# Patient Record
Sex: Female | Born: 2000 | Race: White | Hispanic: No | Marital: Single | Smoking: Light tobacco smoker
Health system: Southern US, Community
[De-identification: ages and names within clinical notes are randomized; demographics above are authoritative.]

## PROBLEM LIST (undated history)

## (undated) DIAGNOSIS — F419 Anxiety disorder, unspecified: Secondary | ICD-10-CM

## (undated) DIAGNOSIS — I10 Essential (primary) hypertension: Secondary | ICD-10-CM

## (undated) DIAGNOSIS — L600 Ingrowing nail: Secondary | ICD-10-CM

## (undated) DIAGNOSIS — F32A Depression, unspecified: Secondary | ICD-10-CM

## (undated) DIAGNOSIS — F909 Attention-deficit hyperactivity disorder, unspecified type: Secondary | ICD-10-CM

## (undated) HISTORY — DX: Anxiety disorder, unspecified: F41.9

## (undated) HISTORY — DX: Depression, unspecified: F32.A

## (undated) HISTORY — DX: Essential (primary) hypertension: I10

## (undated) HISTORY — PX: WISDOM TOOTH EXTRACTION: SHX21

---
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
## 2019-01-30 ENCOUNTER — Other Ambulatory Visit: Payer: Self-pay | Admitting: Family

## 2019-01-30 MED ORDER — LISDEXAMFETAMINE DIMESYLATE 40 MG PO CAPS: 40.0000 mg | ORAL_CAPSULE | ORAL | 0 refills | Status: DC

## 2019-01-31 IMAGING — US US RENAL
1 series · 14 of 25 positions shown · non-contrast
Comparison: None.

CLINICAL DATA: Hypertension.

EXAM:
RENAL / URINARY TRACT ULTRASOUND COMPLETE

[Series 1: us renal · 0.23mm/px · 14 of 25 slices shown]
[im 1/25]
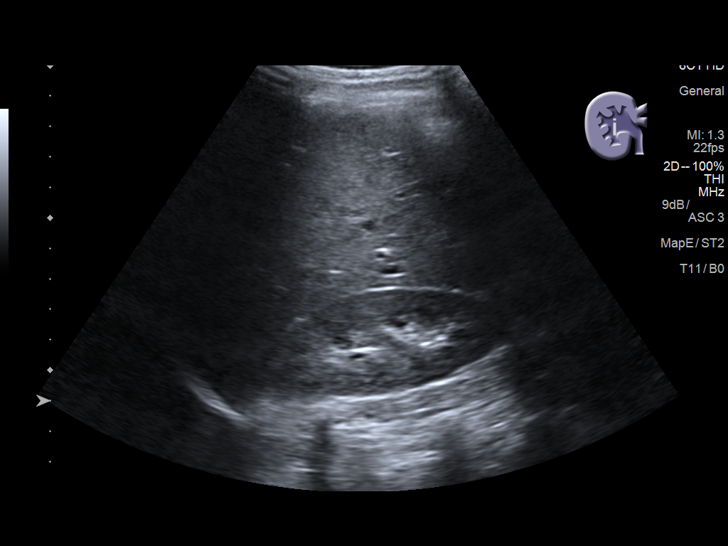
[im 3/25]
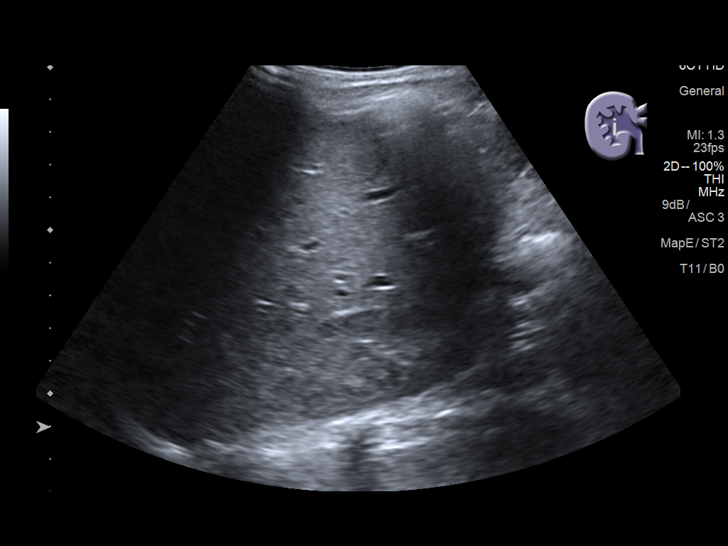
[im 5/25]
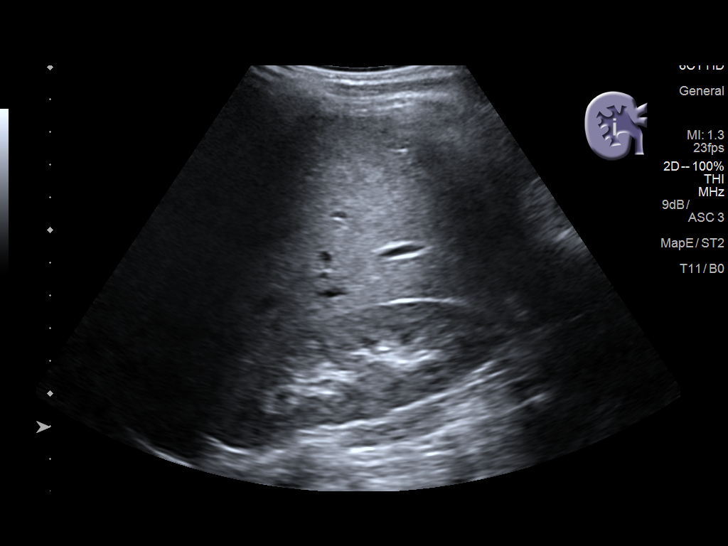
[im 7/25]
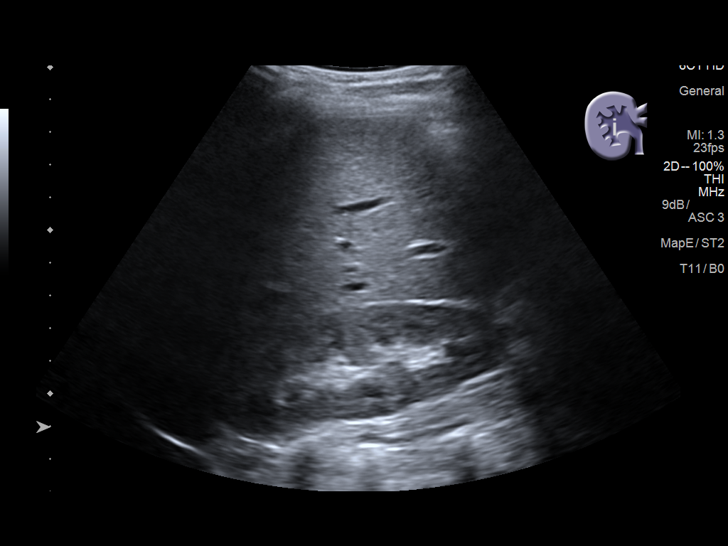
[im 9/25]
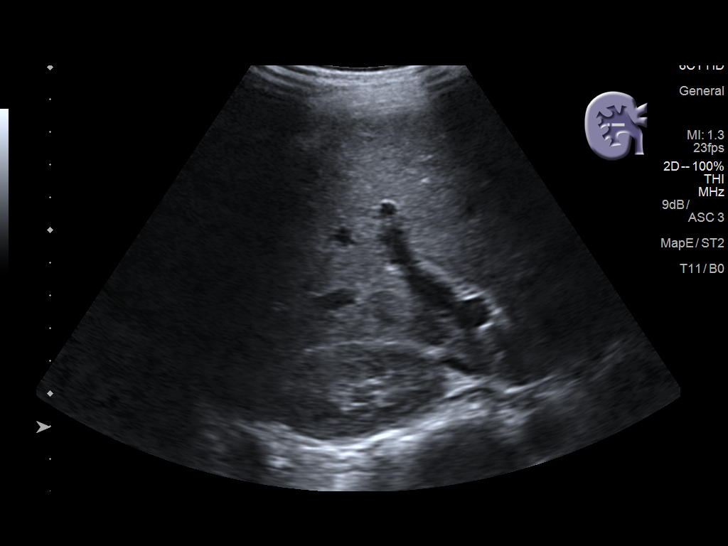
[im 10/25]
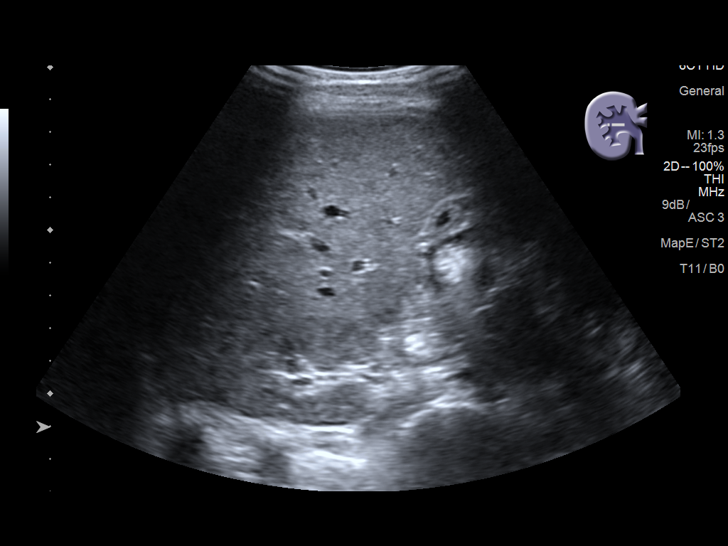
[im 12/25]
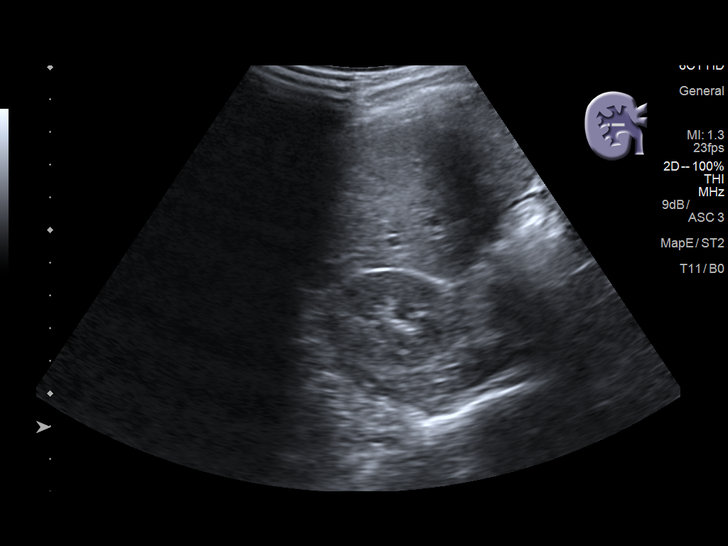
[im 14/25]
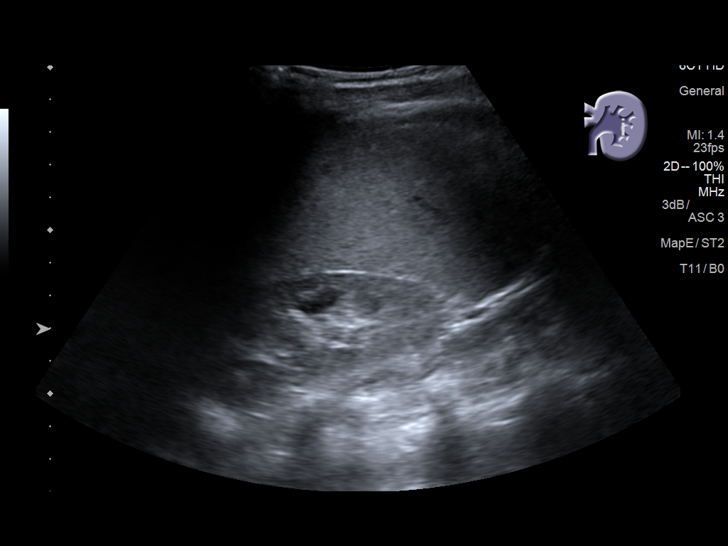
[im 16/25]
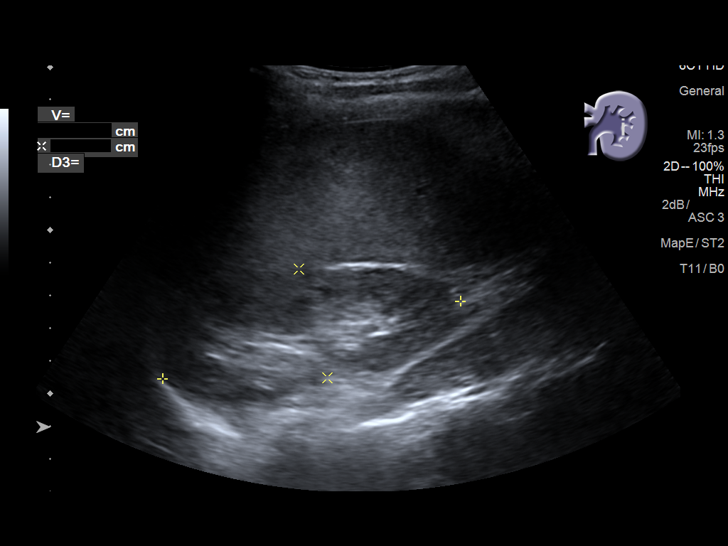
[im 17/25]
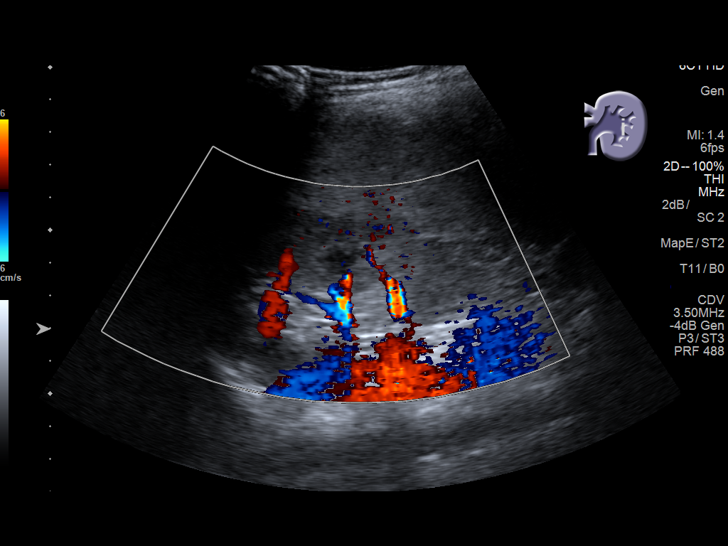
[im 19/25]
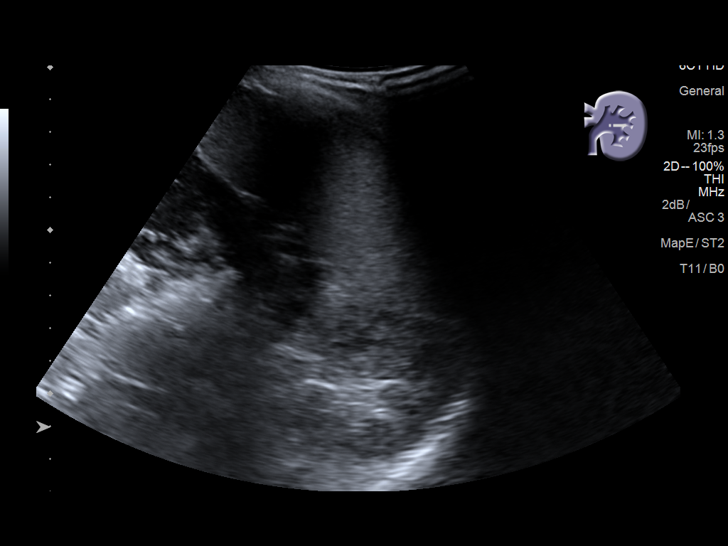
[im 21/25]
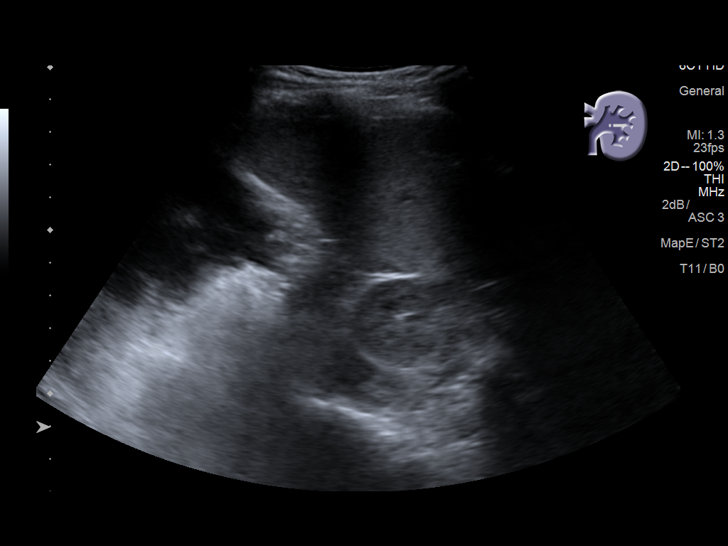
[im 23/25]
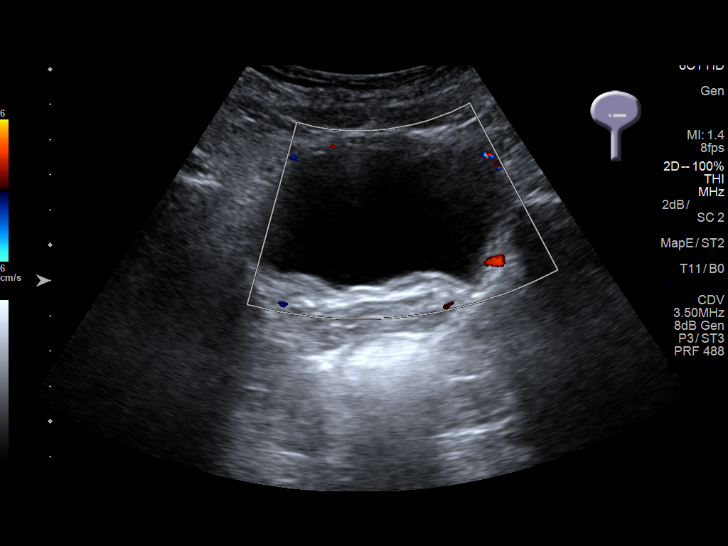
[im 25/25]
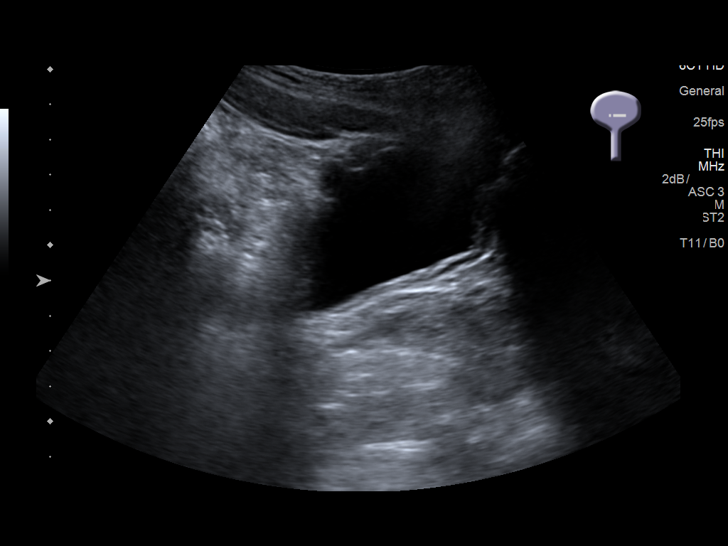

[14 of 25 positions shown; findings below may reference images not displayed]

FINDINGS: Right Kidney:

Renal measurements: 9.6 x 3.4 x 5.1 cm = volume: 85.9 mL .
Echogenicity within normal limits. No mass or hydronephrosis
visualized.

Left Kidney:

Renal measurements: 9.4 x 3.4 x 4.8 cm = volume: 81.6 mL.
Echogenicity within normal limits. No mass or hydronephrosis
visualized.

Bladder:

Appears normal for degree of bladder distention.
IMPRESSION: Normal study.  No cause for hypertension identified.

## 2019-02-12 MED ORDER — MINOCYCLINE HCL 100 MG PO CAPS: 100.0000 mg | ORAL_CAPSULE | Freq: Two times a day (BID) | ORAL | 3 refills | Status: DC

## 2019-02-18 MED ORDER — NORETHINDRONE ACET-ETHINYL EST 1.5-30 MG-MCG PO TABS: 1.0000 | ORAL_TABLET | Freq: Every day | ORAL | 3 refills | Status: DC

## 2019-03-17 ENCOUNTER — Other Ambulatory Visit: Payer: Self-pay | Admitting: Family

## 2019-03-17 MED ORDER — LISDEXAMFETAMINE DIMESYLATE 40 MG PO CAPS: 40.0000 mg | ORAL_CAPSULE | ORAL | 0 refills | Status: DC

## 2019-05-01 ENCOUNTER — Other Ambulatory Visit: Payer: Self-pay | Admitting: Family

## 2019-05-01 MED ORDER — LISDEXAMFETAMINE DIMESYLATE 40 MG PO CAPS: 40.0000 mg | ORAL_CAPSULE | ORAL | 0 refills | Status: DC

## 2019-06-08 ENCOUNTER — Other Ambulatory Visit: Payer: Self-pay | Admitting: Pediatrics

## 2019-06-08 MED ORDER — NORETHINDRONE ACET-ETHINYL EST 1.5-30 MG-MCG PO TABS: 1.0000 | ORAL_TABLET | Freq: Every day | ORAL | 3 refills | Status: DC

## 2019-06-15 ENCOUNTER — Other Ambulatory Visit: Payer: Self-pay | Admitting: Pediatrics

## 2019-07-29 ENCOUNTER — Ambulatory Visit (INDEPENDENT_AMBULATORY_CARE_PROVIDER_SITE_OTHER): Payer: Medicaid Other | Admitting: Family

## 2019-07-29 ENCOUNTER — Encounter: Payer: Self-pay | Admitting: Family

## 2019-07-29 DIAGNOSIS — F4323 Adjustment disorder with mixed anxiety and depressed mood: Secondary | ICD-10-CM

## 2019-07-29 DIAGNOSIS — L700 Acne vulgaris: Secondary | ICD-10-CM | POA: Diagnosis not present

## 2019-07-29 DIAGNOSIS — F902 Attention-deficit hyperactivity disorder, combined type: Secondary | ICD-10-CM

## 2019-07-30 ENCOUNTER — Encounter: Payer: Self-pay | Admitting: Family

## 2019-07-30 NOTE — Progress Notes (Signed)
THIS RECORD MAY CONTAIN CONFIDENTIAL INFORMATION THAT SHOULD NOT BE RELEASED WITHOUT REVIEW OF THE SERVICE PROVIDER.  Virtual Follow-Up Visit via Video Note  I connected with Gail Reed  on 07/29/19 at 10:30 AM EST by a video enabled telemedicine application and verified that I am speaking with the correct person using two identifiers.    This patient visit was completed through the use of an audio/video or telephone encounter in the setting of the State of Emergency due to the COVID-19 Pandemic.  I discussed that the purpose of this telehealth visit is to provide medical care while limiting exposure to the novel coronavirus.       I discussed the limitations of evaluation and management by telemedicine and the availability of in person appointments.    The patient expressed understanding and agreed to proceed.   The patient was physically located at home in [place] or a state in which I am permitted to provide care. The patient and/or parent/guardian understood that s/he may incur co-pays and cost sharing, and agreed to the telemedicine visit. The visit was reasonable and appropriate under the circumstances given the patient's presentation at the time.   The patient and/or parent/guardian has been advised of the potential risks and limitations of this mode of treatment (including, but not limited to, the absence of in-person examination) and has agreed to be treated using telemedicine. The patient's/patient's family's questions regarding telemedicine have been answered.    As this visit was completed in an ambulatory virtual setting, the patient and/or parent/guardian has also been advised to contact their provider's office for worsening conditions, and seek emergency medical treatment and/or call 911 if the patient deems either necessary.    Gail Reed is a 18 y.o. female referred by Marcelina Morel, MD here today for follow-up of ADHD, adjustment disorder with mixed  anxiety and depressed mood.    History was provided by the patient.  PCP Confirmed?  yes  My Chart Activated?   yes   Chief Complaint: ADHD, acne   History of Present Illness:  -going well, needs refill for Vyvanse 40 mg; of note, she was cleared by Cardiology in January 2020 after sinus tachycardia with abnormal EKG, right atrial enlargement.  -today she has no conerning symptoms, specifically no chest pain or dyspnea.  Denies palpitations, denies HA.  -no concerns with focus -appetite is described as normal with no concerns  -sleep is adequate -no si/hi, taking sertraline 50 mg with benefit.  -needs acne meds  nexplanon with Junel for BTB as needed -no concerns for STI screening  Review of Systems  Constitutional: Negative for fever, malaise/fatigue and weight loss.  HENT: Negative for sore throat.   Eyes: Negative for blurred vision and photophobia.  Respiratory: Negative for cough and shortness of breath.   Cardiovascular: Negative for chest pain and palpitations.  Gastrointestinal: Negative for abdominal pain and heartburn.  Genitourinary: Negative for dysuria and frequency.  Musculoskeletal: Negative for myalgias.  Neurological: Negative for dizziness.  Psychiatric/Behavioral: The patient is not nervous/anxious.      Allergies  Allergen Reactions  . Amoxicillin Rash    AS A CHILD   Outpatient Medications Prior to Visit  Medication Sig Dispense Refill  . clindamycin-benzoyl peroxide (BENZACLIN) gel 1 (ONE) APPLICATION TO AFFECTED AREA NIGHTLY  6  . lisdexamfetamine (VYVANSE) 40 MG capsule Take 1 capsule (40 mg total) by mouth every morning. 30 capsule 0  . minocycline (MINOCIN) 100 MG capsule TAKE 1 CAPSULE BY MOUTH TWICE  A DAY 60 capsule 3  . Norethindrone Acetate-Ethinyl Estradiol (JUNEL 1.5/30) 1.5-30 MG-MCG tablet Take 1 tablet by mouth daily. 1 Package 3  . sertraline (ZOLOFT) 50 MG tablet Take 1 tablet (50 mg total) by mouth daily. 90 tablet 0   No  facility-administered medications prior to visit.      Patient Active Problem List   Diagnosis Date Noted  . Sinus tachycardia 09/25/2018  . Abnormal EKG 09/25/2018  . Elevated BP without diagnosis of hypertension 04/28/2018  . 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 Nexplanon in place 11/18/2017  . Attention deficit hyperactivity disorder (ADHD), 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  . Adjustment disorder 10/22/2017    Past Medical History:  Reviewed and updated?  yes Past Medical History:  Diagnosis Date  . ADHD (attention deficit hyperactivity disorder)   . Ingrown left big toenail 01/2015    Family History: Reviewed and updated? yes Family History  Problem Relation Age of Onset  . Bipolar disorder Brother   . Eating disorder Maternal Aunt   . Hypertension Mother   . Hypertension Father   . Hyperlipidemia Father   . Hyperlipidemia Paternal Aunt   . Hyperlipidemia Maternal Grandmother    The following portions of the patient's history were reviewed and updated as appropriate: allergies, current medications, past family history, past medical history, past social history, past surgical history and problem list.  Visual Observations/Objective:   General Appearance: Well nourished well developed, in no apparent distress.  Eyes: conjunctiva no swelling or erythema ENT/Mouth: No hoarseness, No cough for duration of visit.  Neck: Supple  Respiratory: Respiratory effort normal, normal rate, no retractions or distress.   Cardio: Appears well-perfused, noncyanotic Musculoskeletal: no obvious deformity Skin: visible skin without rashes, ecchymosis, erythema Neuro: Awake and oriented X 3,  Psych:  normal affect, Insight and Judgment appropriate.    Assessment/Plan: 1. Attention deficit hyperactivity disorder (ADHD), combined type -stable on current dose Vvyanse 40 mg without adverse effects  -return precautions given   2. Adjustment disorder with mixed anxiety and depressed mood -doing  well with sertraline 50 mg  -no indication for change in therapy  3. Acne vulgaris -Rx refilled - benzaclin   I discussed the assessment and treatment plan with the patient and/or parent/guardian.  They were provided an opportunity to ask questions and all were answered.  They agreed with the plan and demonstrated an understanding of the instructions. They were advised to call back or seek an in-person evaluation in the emergency room if the symptoms worsen or if the condition fails to improve as anticipated.   Follow-up:  3 month follow-up or sooner as needed  Medical decision-making:   I spent 15 minutes on this telehealth visit inclusive of face-to-face video and care coordination time I was located remote in [place] during this encounter.   Georges Mouse, NP    CC: [name], MD, [name], MD

## 2019-07-31 ENCOUNTER — Encounter: Payer: Self-pay | Admitting: Family

## 2019-08-03 ENCOUNTER — Other Ambulatory Visit: Payer: Self-pay | Admitting: Family

## 2019-08-03 DIAGNOSIS — F4323 Adjustment disorder with mixed anxiety and depressed mood: Secondary | ICD-10-CM

## 2019-08-03 MED ORDER — LISDEXAMFETAMINE DIMESYLATE 40 MG PO CAPS: 40.0000 mg | ORAL_CAPSULE | ORAL | 0 refills | Status: DC

## 2019-08-03 MED ORDER — SERTRALINE HCL 50 MG PO TABS: 50.0000 mg | ORAL_TABLET | Freq: Every day | ORAL | 0 refills | Status: DC

## 2019-08-03 MED ORDER — CLINDAMYCIN PHOS-BENZOYL PEROX 1-5 % EX GEL: CUTANEOUS | 6 refills | Status: DC

## 2019-08-05 ENCOUNTER — Encounter: Payer: Self-pay | Admitting: Family

## 2019-08-06 ENCOUNTER — Encounter: Payer: Self-pay | Admitting: Family

## 2019-08-11 ENCOUNTER — Other Ambulatory Visit: Payer: Self-pay

## 2019-08-11 DIAGNOSIS — Z20822 Contact with and (suspected) exposure to covid-19: Secondary | ICD-10-CM

## 2019-08-13 LAB — NOVEL CORONAVIRUS, NAA: SARS-CoV-2, NAA: NOT DETECTED

## 2019-10-09 ENCOUNTER — Other Ambulatory Visit: Payer: Self-pay | Admitting: Family

## 2019-10-09 ENCOUNTER — Encounter: Payer: Self-pay | Admitting: Family

## 2019-10-09 MED ORDER — LISDEXAMFETAMINE DIMESYLATE 40 MG PO CAPS: 40.0000 mg | ORAL_CAPSULE | ORAL | 0 refills | Status: DC

## 2019-10-28 ENCOUNTER — Telehealth (INDEPENDENT_AMBULATORY_CARE_PROVIDER_SITE_OTHER): Payer: Medicaid Other | Admitting: Family

## 2019-10-28 DIAGNOSIS — F4323 Adjustment disorder with mixed anxiety and depressed mood: Secondary | ICD-10-CM

## 2019-10-28 DIAGNOSIS — L709 Acne, unspecified: Secondary | ICD-10-CM | POA: Insufficient documentation

## 2019-10-28 DIAGNOSIS — F902 Attention-deficit hyperactivity disorder, combined type: Secondary | ICD-10-CM | POA: Diagnosis not present

## 2019-10-28 DIAGNOSIS — R630 Anorexia: Secondary | ICD-10-CM

## 2019-10-28 DIAGNOSIS — Z72820 Sleep deprivation: Secondary | ICD-10-CM | POA: Diagnosis not present

## 2019-10-28 NOTE — Progress Notes (Signed)
This note is not being shared with the patient for the following reason: To respect privacy (The patient or proxy has requested that the information not be shared).  THIS RECORD MAY CONTAIN CONFIDENTIAL INFORMATION THAT SHOULD NOT BE RELEASED WITHOUT REVIEW OF THE SERVICE PROVIDER.  Virtual Follow-Up Visit via Video Note  I connected with Gail Reed 's patient  on 10/28/19 at  4:00 PM EST by a video enabled telemedicine application and verified that I am speaking with the correct person using two identifiers.   Patient/parent location: dorm room   I discussed the limitations of evaluation and management by telemedicine and the availability of in person appointments.  I discussed that the purpose of this telehealth visit is to provide medical care while limiting exposure to the novel coronavirus.  The patient expressed understanding and agreed to proceed.   Gail Reed is a 19 y.o. female referred by Marcelina Morel, MD here today for follow-up of ADHD.  Previsit planning completed:  yes   History was provided by the patient  Plan from Last Visit:   anxiety / depressed mood - continue sertraline 35m ADHD - continue vyvanse 410macne - benzaclin, minocycline  Nexplanon, Junel (BTB prn)  Cardiology - Normal ECHO. Repeat ECG normal. Documentation seems to suggest reducing Vyvanse dose? Unclear.  Chief Complaint: Poor concentrattion  History of Present Illness:   [employer] sophomore, studying dance. 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 Concerned poor concentration is preventing her from doing better.  ADHD - Continues to take daily Vyvanse. Says dose unchanged from prior visit. Having trouble concentrating on HW.  Last semester concentration was good; since that time, had breakup with boyfriend that she used to study with. Now no longer has study group. Works better when others around because they keep her on track. Studies in dorm room  -- has not tried going to [employer]: has missed a few assignments; did not forget assignment, "not sure why I didn't turn them in." - Appetite: poor for ~1 month. Eating one meal per day + snack. Not drinking much water. Unsure why decreased appetite. Appetite typically much better when not taking vyvanse, though off vyvanse over winter break and appetite did not return. Can tell she has lost weight; does not have a scale but pants are fitting looser. Has been successful in eating 3x per day last two days. Sometimes forgets to eat or doesn't have time to. Often doesn't go to cafeteria because it "takes a long time to decide on outfit" since she typically wears a leotard for dancing. - Sleep: Going to sleep at 2am because up with friends in her dorm. Sleeps 2am-7am. No roommate. No trouble staying asleep.   Anxiety / depressed mood - sertraline 5039m Mood has been "great." Denies feeling sad, down, depressed. No anhedonia. - Denies SI  Refills needed: none   ROS:  negative  Allergies  Allergen Reactions  . Amoxicillin Rash    AS A CHILD   Outpatient Medications Prior to Visit  Medication Sig Dispense Refill  . clindamycin-benzoyl peroxide (BENZACLIN) gel 1 (ONE) APPLICATION TO AFFECTED AREA NIGHTLY 25 g 6  . lisdexamfetamine (VYVANSE) 40 MG capsule Take 1 capsule (40 mg total) by mouth every morning. 30 capsule 0  . minocycline (MINOCIN) 100 MG capsule TAKE 1 CAPSULE BY MOUTH TWICE A DAY 60 capsule 3  . Norethindrone Acetate-Ethinyl Estradiol (JUNEL 1.5/30) 1.5-30 MG-MCG tablet Take 1 tablet by mouth daily.  1 Package 3  . sertraline (ZOLOFT) 50 MG tablet Take 1 tablet (50 mg total) by mouth daily. 90 tablet 0   No facility-administered medications prior to visit.     Patient Active Problem List   Diagnosis Date Noted  . Sinus tachycardia 09/25/2018  . Abnormal EKG 09/25/2018  . Elevated BP without diagnosis of hypertension 04/28/2018  . 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  Nexplanon in place 11/18/2017  . Attention deficit hyperactivity disorder (ADHD), 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  . Adjustment disorder 10/22/2017    Visual Observations/Objective:  General Appearance: Well nourished well developed, in no apparent distress.  Eyes: conjunctiva no swelling or erythema ENT/Mouth: No hoarseness, No cough for duration of visit.  Neck: Supple  Respiratory: Respiratory effort normal, normal rate, no retractions or distress.   Cardio: Appears well-perfused, noncyanotic Musculoskeletal: no obvious deformity Skin: visible skin without rashes, ecchymosis, erythema Neuro: Awake and oriented X 3,  Psych:  normal affect, Insight and Judgment appropriate.    Assessment/Plan: 1. Attention deficit hyperactivity disorder (ADHD), combined type - Continue vyvanse  - avoid increasing dose given prior cardiac SEs - Study in [employer] with study group, make study calendar so not procrastinating on assignments - Improved sleep to improve concentration  2. Sleep deficient See above - For next week, force self to get into bed no later than 1am (recommended earlier)  3. Acne, unspecified acne type Continue meds as above  4. Adjustment disorder with mixed anxiety and depressed mood Continue sertraline. Doing well.  5. Decreased appetite See above - Keep food in dorm room (discussed bagels etc) for easy breakfasts - Wardrobe / appearance currently barrier to going to cafeteria. Decide on wardrobe night before, wear those clothes all day except when dancing. - Scheduled time for meals in cafeteria    I discussed the assessment and treatment plan with the patient and/or parent/guardian.  They were provided an opportunity to ask questions and all were answered.  They agreed with the plan and demonstrated an understanding of the instructions. They were advised to call back or seek an in-person evaluation in the emergency room if the symptoms worsen or if the condition fails to  improve as anticipated.   Follow-up:   1-2 weeks in person for weight, vitals check  Medical decision-making:   I spent 40 minutes on this telehealth visit inclusive of face-to-face video and care coordination time I was located at [facility] during this encounter.   Harlon Ditty, MD    CC: Marcelina Morel, MD, Marcelina Morel, MD

## 2019-10-29 ENCOUNTER — Encounter: Payer: Self-pay | Admitting: Family

## 2019-10-29 NOTE — Progress Notes (Signed)
Supervising Provider Co-Signature  I reviewed with the resident the medical history and the resident's findings.  I discussed with the resident the patient's diagnosis and concur with the treatment plan as documented in the resident's note.  Artyom Stencel M Nyree Yonker, NP  

## 2019-11-11 ENCOUNTER — Telehealth: Payer: Self-pay | Admitting: Pediatrics

## 2019-11-11 NOTE — Telephone Encounter (Signed)
Sent patient a MyChart message

## 2019-11-11 NOTE — Telephone Encounter (Signed)
Called to [facility] for upcoming vis

## 2019-11-11 NOTE — Telephone Encounter (Signed)
Called to [facility] patient for upcoming appointment and she explained that she was unable to do in person at the time due to having class. She would like to know if this appointment could be Virtual and if she can get a call back to reschedule. Phone number listed is correct.

## 2019-11-12 ENCOUNTER — Other Ambulatory Visit: Payer: Self-pay | Admitting: Pediatrics

## 2019-11-12 ENCOUNTER — Encounter: Payer: Self-pay | Admitting: Family

## 2019-11-12 ENCOUNTER — Ambulatory Visit: Payer: Medicaid Other | Admitting: Family

## 2019-11-12 MED ORDER — LISDEXAMFETAMINE DIMESYLATE 40 MG PO CAPS: 40.0000 mg | ORAL_CAPSULE | ORAL | 0 refills | Status: DC

## 2019-11-12 MED ORDER — NORETHINDRONE ACET-ETHINYL EST 1.5-30 MG-MCG PO TABS: 1.0000 | ORAL_TABLET | Freq: Every day | ORAL | 3 refills | Status: DC

## 2019-11-12 MED ORDER — CETIRIZINE HCL 10 MG PO TABS: ORAL_TABLET | ORAL | 2 refills | Status: DC

## 2019-12-09 ENCOUNTER — Ambulatory Visit: Payer: Medicaid Other | Attending: Internal Medicine

## 2019-12-09 DIAGNOSIS — Z20822 Contact with and (suspected) exposure to covid-19: Secondary | ICD-10-CM

## 2019-12-10 LAB — NOVEL CORONAVIRUS, NAA: SARS-CoV-2, NAA: NOT DETECTED

## 2019-12-10 LAB — SARS-COV-2, NAA 2 DAY TAT

## 2019-12-22 ENCOUNTER — Other Ambulatory Visit: Payer: Self-pay

## 2019-12-23 MED ORDER — LISDEXAMFETAMINE DIMESYLATE 40 MG PO CAPS: 40.0000 mg | ORAL_CAPSULE | ORAL | 0 refills | Status: DC

## 2019-12-29 ENCOUNTER — Other Ambulatory Visit: Payer: Self-pay | Admitting: Family

## 2020-01-23 ENCOUNTER — Encounter: Payer: Self-pay | Admitting: Family

## 2020-01-30 ENCOUNTER — Encounter: Payer: Self-pay | Admitting: Family

## 2020-02-05 ENCOUNTER — Other Ambulatory Visit: Payer: Self-pay

## 2020-02-05 ENCOUNTER — Other Ambulatory Visit: Payer: Self-pay | Admitting: Family

## 2020-02-05 ENCOUNTER — Encounter (HOSPITAL_COMMUNITY): Payer: Self-pay

## 2020-02-05 ENCOUNTER — Ambulatory Visit (HOSPITAL_COMMUNITY)
Admission: EM | Admit: 2020-02-05 | Discharge: 2020-02-05 | Disposition: A | Discharge status: Home / Self Care | Payer: Medicaid Other | Attending: Physician Assistant | Admitting: Physician Assistant

## 2020-02-05 DIAGNOSIS — L509 Urticaria, unspecified: Secondary | ICD-10-CM | POA: Diagnosis not present

## 2020-02-05 DIAGNOSIS — F4323 Adjustment disorder with mixed anxiety and depressed mood: Secondary | ICD-10-CM

## 2020-02-05 MED ORDER — CETIRIZINE HCL 10 MG PO TABS: 10.0000 mg | ORAL_TABLET | Freq: Every day | ORAL | 0 refills | Status: DC

## 2020-02-05 MED ORDER — PREDNISONE 10 MG PO TABS: 40.0000 mg | ORAL_TABLET | Freq: Every day | ORAL | 0 refills | Status: AC

## 2020-02-05 NOTE — ED Provider Notes (Signed)
[facility]    CSN: [id number] Arrival date & time: 02/05/20  1734      History   Chief Complaint Chief Complaint  Patient presents with  . Rash    HPI Gail Reed is a 19 y.o. female.   Patient reports for evaluation of rash.  She reports she has had hives like rash since early May.  She reports it is itchy.  She reports it got worse after she stopped taking Zyrtec.  She reports first very long time she is gone itchy if she did not take Zyrtec every day.  She is unclear of what she is allergic to.  She denies the rash is painful.  No shortness of breath.  No coughing or wheezing.  She denies any new lotions, soaps or exposures.  She has no idea what she can be allergic to.  She is adamant this has been an ongoing issue.  She had MyChart conversation with what appeared to be a previous primary care provider in mid May discussing same.     Past Medical History:  Diagnosis Date  . ADHD (attention deficit hyperactivity disorder)   . Ingrown left big toenail 01/2015    Patient Active Problem List   Diagnosis Date Noted  . Sleep deficient 10/28/2019  . Acne 10/28/2019  . Decreased appetite 10/28/2019  . Sinus tachycardia 09/25/2018  . Abnormal EKG 09/25/2018  . Elevated BP without diagnosis of hypertension 04/28/2018  . 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 Nexplanon in place 11/18/2017  . Attention deficit hyperactivity disorder (ADHD), 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  . Adjustment disorder 10/22/2017    Past Surgical History:  Procedure Laterality Date  . LABIOPLASTY N/A 03/21/2018   Procedure: LABIAPLASTY;  Surgeon: Glenna Fellows, MD;  Location: [facility];  Service: Plastics;  Laterality: N/A;  . STRABISMUS SURGERY Bilateral 04/16/2003  . WISDOM TOOTH EXTRACTION      OB History   No obstetric history on file.      Home Medications    Prior to Admission medications   Medication Sig Start Date End Date Taking? Authorizing Provider    cetirizine (ZYRTEC ALLERGY) 10 MG tablet Take 1 tablet (10 mg total) by mouth daily. 02/05/20   Zaylei Mullane, Veryl Speak, PA-C  clindamycin-benzoyl peroxide (BENZACLIN) gel 1 (ONE) APPLICATION TO AFFECTED AREA NIGHTLY 08/03/19   Georges Mouse, NP  lisdexamfetamine (VYVANSE) 40 MG capsule Take 1 capsule (40 mg total) by mouth every morning. 12/23/19   Georges Mouse, NP  minocycline (MINOCIN) 100 MG capsule TAKE 1 CAPSULE BY MOUTH TWICE A DAY 06/15/19   Alfonso Ramus T, FNP  Norethindrone Acetate-Ethinyl Estradiol (JUNEL 1.5/30) 1.5-30 MG-MCG tablet Take 1 tablet by mouth daily. 11/12/19   Verneda Skill, FNP  predniSONE (DELTASONE) 10 MG tablet Take 4 tablets (40 mg total) by mouth daily for 5 days. 02/05/20 02/10/20  Joia Doyle, Veryl Speak, PA-C  sertraline (ZOLOFT) 50 MG tablet Take 1 tablet (50 mg total) by mouth daily. 08/03/19   Georges Mouse, NP    Family History Family History  Problem Relation Age of Onset  . Bipolar disorder Brother   . Eating disorder Maternal Aunt   . Hypertension Mother   . Hypertension Father   . Hyperlipidemia Father   . Hyperlipidemia Paternal Aunt   . Hyperlipidemia Maternal Grandmother     Social History Social History   Tobacco Use  . Smoking status: Never Smoker  . Smokeless tobacco: Never Used  Substance Use Topics  .  Alcohol use: No  . Drug use: Yes    Types: Marijuana     Allergies   Amoxicillin   Review of Systems Review of Systems   Physical Exam Triage Vital Signs ED Triage Vitals  Enc Vitals Group     BP 02/05/20 1834 (!) 148/84     Pulse Rate 02/05/20 1834 96     Resp 02/05/20 1834 14     Temp 02/05/20 1834 98.3 F (36.8 C)     Temp Source 02/05/20 1834 Oral     SpO2 02/05/20 1834 100 %     Weight --      Height --      Head Circumference --      Peak Flow --      Pain Score 02/05/20 1835 8     Pain Loc --      Pain Edu? --      Excl. in [place]? --    No data found.  Updated Vital Signs BP (!) 148/84 (BP Location: Left  Arm)   Pulse 96   Temp 98.3 F (36.8 C) (Oral)   Resp 14   SpO2 100%   Visual Acuity Right Eye Distance:   Left Eye Distance:   Bilateral Distance:    Right Eye Near:   Left Eye Near:    Bilateral Near:     Physical Exam Vitals and nursing note reviewed.  Constitutional:      General: She is not in acute distress.    Appearance: She is well-developed.  HENT:     Head: Normocephalic and atraumatic.  Eyes:     Conjunctiva/sclera: Conjunctivae normal.  Cardiovascular:     Rate and Rhythm: Normal rate and regular rhythm.     Heart sounds: No murmur.  Pulmonary:     Effort: Pulmonary effort is normal. No respiratory distress.     Breath sounds: Normal breath sounds.  Abdominal:     Palpations: Abdomen is soft.     Tenderness: There is no abdominal tenderness.  Musculoskeletal:     Cervical back: Neck supple.  Skin:    General: Skin is warm and dry.     Comments: Blanching erythematous slightly raised rash with dermatographia bilateral thighs.  There are some punctate openings from where she has scratched.  There are a few similar rashes on the left upper extremity.  No pustules or vesicles  Neurological:     Mental Status: She is alert.      UC Treatments / Results  Labs (all labs ordered are listed, but only abnormal results are displayed) Labs Reviewed - No data to display  EKG   Radiology No results found.  Procedures Procedures (including critical care time)  Medications Ordered in UC Medications - No data to display  Initial Impression / Assessment and Plan / UC Course  I have reviewed the triage vital signs and the nursing notes.  Pertinent labs & imaging results that were available during my care of the patient were reviewed by me and considered in my medical decision making (see chart for details).     #Urticaria Patient is a 19 year old with acute on chronic urticarial rash.  Unclear trigger.  Will place on prednisone, recommend twice daily  Zyrtec until rash is under control and then once daily.  Instructed to reestablish with primary care for follow-up and allergy testing.  Return precautions were discussed.  Patient verbalized understanding. Final Clinical Impressions(s) / UC Diagnoses   Final diagnoses:  Hives  Discharge Instructions     Take the prednisone in the morning for the next 5 days Take Zyrtec twice a day for the next 5 days and then resume once a day  Schedule with primary care at [facility] as previously discussed by your nurse practitioner.  If acutely worsening prior to follow-up with primary care please return.    ED Prescriptions    Medication Sig Dispense Auth. Provider   predniSONE (DELTASONE) 10 MG tablet Take 4 tablets (40 mg total) by mouth daily for 5 days. 20 tablet Gwenlyn Hottinger, Veryl Speak, PA-C   cetirizine (ZYRTEC ALLERGY) 10 MG tablet Take 1 tablet (10 mg total) by mouth daily. 30 tablet Lamone Ferrelli, Veryl Speak, PA-C     PDMP not reviewed this encounter.   Hermelinda Medicus, PA-C 02/06/20 1250

## 2020-02-05 NOTE — ED Triage Notes (Signed)
Patient reports rash on thighs for "a while". Unable to give exact timeframe for when symptoms began, but reports it has started to spread.

## 2020-02-05 NOTE — Discharge Instructions (Addendum)
Take the prednisone in the morning for the next 5 days Take Zyrtec twice a day for the next 5 days and then resume once a day  Schedule with primary care at [facility] as previously discussed by your nurse practitioner.  If acutely worsening prior to follow-up with primary care please return.

## 2020-02-08 MED ORDER — SERTRALINE HCL 50 MG PO TABS: 50.0000 mg | ORAL_TABLET | Freq: Every day | ORAL | 0 refills | Status: DC

## 2020-02-08 MED ORDER — LISDEXAMFETAMINE DIMESYLATE 40 MG PO CAPS: 40.0000 mg | ORAL_CAPSULE | ORAL | 0 refills | Status: DC

## 2020-03-19 ENCOUNTER — Other Ambulatory Visit: Payer: Self-pay

## 2020-03-22 MED ORDER — LISDEXAMFETAMINE DIMESYLATE 40 MG PO CAPS: 40.0000 mg | ORAL_CAPSULE | ORAL | 0 refills | Status: DC

## 2020-04-07 ENCOUNTER — Other Ambulatory Visit: Payer: Self-pay | Admitting: Pediatrics

## 2020-04-14 ENCOUNTER — Other Ambulatory Visit: Payer: Self-pay | Admitting: Pediatrics

## 2020-04-14 ENCOUNTER — Telehealth: Payer: Self-pay

## 2020-04-14 MED ORDER — NORETHINDRONE ACET-ETHINYL EST 1.5-30 MG-MCG PO TABS: 1.0000 | ORAL_TABLET | Freq: Every day | ORAL | 3 refills | Status: DC

## 2020-04-14 NOTE — Telephone Encounter (Signed)
Received request from pharmacy to amend RX for a 28 day supply for Norethindrone Acetate-Ethinyl Estradiol (JUNEL 1.5/30) 1.5-30 MG-MCG tablet due to new insurance. Routing to provider.

## 2020-04-14 NOTE — Telephone Encounter (Signed)
Done

## 2020-05-02 ENCOUNTER — Other Ambulatory Visit: Payer: Self-pay

## 2020-05-09 ENCOUNTER — Telehealth (INDEPENDENT_AMBULATORY_CARE_PROVIDER_SITE_OTHER): Payer: Medicaid Other | Admitting: Family

## 2020-05-09 ENCOUNTER — Encounter: Payer: Self-pay | Admitting: Family

## 2020-05-09 ENCOUNTER — Other Ambulatory Visit: Payer: Self-pay | Admitting: Family

## 2020-05-09 DIAGNOSIS — F4323 Adjustment disorder with mixed anxiety and depressed mood: Secondary | ICD-10-CM

## 2020-05-09 DIAGNOSIS — F902 Attention-deficit hyperactivity disorder, combined type: Secondary | ICD-10-CM | POA: Diagnosis not present

## 2020-05-09 MED ORDER — SERTRALINE HCL 50 MG PO TABS: 50.0000 mg | ORAL_TABLET | Freq: Every day | ORAL | 0 refills | Status: DC

## 2020-05-09 MED ORDER — CETIRIZINE HCL 10 MG PO TABS: 10.0000 mg | ORAL_TABLET | Freq: Every day | ORAL | 0 refills | Status: DC

## 2020-05-09 MED ORDER — LISDEXAMFETAMINE DIMESYLATE 40 MG PO CAPS: 40.0000 mg | ORAL_CAPSULE | ORAL | 0 refills | Status: DC

## 2020-05-09 NOTE — Progress Notes (Signed)
This note is not being shared with the patient for the following reason: To respect privacy (The patient or proxy has requested that the information not be shared).  THIS RECORD MAY CONTAIN CONFIDENTIAL INFORMATION THAT SHOULD NOT BE RELEASED WITHOUT REVIEW OF THE SERVICE PROVIDER.  Virtual Follow-Up Visit via Video Note  I connected with Gail Reed  on 05/09/20 at  9:30 AM EDT by a video enabled telemedicine application and verified that I am speaking with the correct person using two identifiers.   Patient/parent location: home   I discussed the limitations of evaluation and management by telemedicine and the availability of in person appointments.  I discussed that the purpose of this telehealth visit is to provide medical care while limiting exposure to the novel coronavirus.  The patient expressed understanding and agreed to proceed.   Gail Reed is a 19 y.o. female referred by [name], MD here today for follow-up of ADHD, combined type.   History was provided by the patient.  Plan from Last Visit:   Sertraline 50 mg  Vyvanse 40 mg   Chief Complaint: ADHD, combined type   History of Present Illness:  -restarted sertraline 50 mg last few weeks; had been off it early summer  -[employer] (2 weeks)  -dance major  -implant: using OCPs for BTB - had inserted 10/2017  -heart racing with CBD, marijuana use -no SI/HI   Review of Systems  Constitutional: Negative for fever and malaise/fatigue.  HENT: Negative for sore throat.   Eyes: Negative for blurred vision and pain.  Cardiovascular: Positive for palpitations. Negative for chest pain.  Gastrointestinal: Negative for abdominal pain, nausea and vomiting.  Genitourinary: Negative for dysuria.  Musculoskeletal: Negative for myalgias.  Skin: Negative for rash.  Neurological: Negative for dizziness and headaches.  Psychiatric/Behavioral: Negative for depression and suicidal ideas. The patient is nervous/anxious.       Allergies  Allergen Reactions  . Amoxicillin Rash    AS A CHILD   Outpatient Medications Prior to Visit  Medication Sig Dispense Refill  . clindamycin-benzoyl peroxide (BENZACLIN) gel 1 (ONE) APPLICATION TO AFFECTED AREA NIGHTLY 25 g 6  . minocycline (MINOCIN) 100 MG capsule TAKE 1 CAPSULE BY MOUTH TWICE A DAY 60 capsule 3  . Norethindrone Acetate-Ethinyl Estradiol (JUNEL 1.5/30) 1.5-30 MG-MCG tablet Take 1 tablet by mouth daily. 28 tablet 3  . cetirizine (ZYRTEC ALLERGY) 10 MG tablet Take 1 tablet (10 mg total) by mouth daily. 30 tablet 0  . lisdexamfetamine (VYVANSE) 40 MG capsule Take 1 capsule (40 mg total) by mouth every morning. 30 capsule 0  . sertraline (ZOLOFT) 50 MG tablet Take 1 tablet (50 mg total) by mouth daily. 90 tablet 0   No facility-administered medications prior to visit.     Patient Active Problem List   Diagnosis Date Noted  . Sleep deficient 10/28/2019  . Acne 10/28/2019  . Decreased appetite 10/28/2019  . Sinus tachycardia 09/25/2018  . Abnormal EKG 09/25/2018  . Elevated BP without diagnosis of hypertension 04/28/2018  . 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 Nexplanon in place 11/18/2017  . Attention deficit hyperactivity disorder (ADHD), 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  . Adjustment disorder 10/22/2017   The following portions of the patient's history were reviewed and updated as appropriate: allergies, current medications, past family history, past medical history, past social history, past surgical history and problem list.  Visual Observations/Objective:   General Appearance: Well nourished well developed, in no apparent distress.  Eyes: conjunctiva no swelling or erythema ENT/Mouth:  No hoarseness, No cough for duration of visit.  Neck: Supple  Respiratory: Respiratory effort normal, normal rate, no retractions or distress.   Cardio: Appears well-perfused, noncyanotic Musculoskeletal: no obvious deformity Skin: visible skin without rashes,  ecchymosis, erythema Neuro: Awake and oriented X 3,  Psych:  normal affect, Insight and Judgment appropriate.    Assessment/Plan:  19 yo A/I female presents for medication management/refills for adjustment disorder with mixed anxiety and depressed mood and ADHD, combined type. Recommended in-personvisit for vital signs to assess BP in the context of palpitations and stimulant use as well as with use of COCs for BTB with Nexplanon; also discussed concerns of CBD and marijuana use in combination with SSRI and stimulant. Screening tools at that time also.   1. Adjustment disorder with mixed anxiety and depressed mood - sertraline (ZOLOFT) 50 MG tablet; Take 1 tablet (50 mg total) by mouth daily.  Dispense: 90 tablet; Refill: 0  2. Attention deficit hyperactivity disorder (ADHD), combined type - lisdexamfetamine (VYVANSE) 40 MG capsule; Take 1 capsule (40 mg total) by mouth every morning.  Dispense: 30 capsule; Refill: 0    BH screenings:  PHQ-SADS Last 3 Score only 04/28/2018 11/05/2017 10/22/2017  PHQ-15 Score 3 2 3   Total GAD-7 Score 0 2 4  PHQ-9 Total Score 1 4 5     Screens discussed with patient and parent and adjustments to plan made accordingly.   I discussed the assessment and treatment plan with the patient and/or parent/guardian.  They were provided an opportunity to ask questions and all were answered.  They agreed with the plan and demonstrated an understanding of the instructions. They were advised to call back or seek an in-person evaluation in the emergency room if the symptoms worsen or if the condition fails to improve as anticipated.   Follow-up:   8/31 in person   Medical decision-making:   I spent 20 minutes on this telehealth visit inclusive of face-to-face video and care coordination time I was located remote during this encounter.   , NP    CC: 9/31, MD, Georges Mouse, MD

## 2020-05-10 ENCOUNTER — Other Ambulatory Visit: Payer: Self-pay | Admitting: Family

## 2020-05-17 ENCOUNTER — Ambulatory Visit: Payer: Medicaid Other | Admitting: Pediatrics

## 2020-05-31 ENCOUNTER — Other Ambulatory Visit: Payer: Self-pay | Admitting: Pediatrics

## 2020-06-20 ENCOUNTER — Other Ambulatory Visit: Payer: Self-pay | Admitting: Family

## 2020-06-22 ENCOUNTER — Other Ambulatory Visit: Payer: Self-pay | Admitting: Family

## 2020-06-22 DIAGNOSIS — F902 Attention-deficit hyperactivity disorder, combined type: Secondary | ICD-10-CM

## 2020-06-23 MED ORDER — LISDEXAMFETAMINE DIMESYLATE 40 MG PO CAPS: 40.0000 mg | ORAL_CAPSULE | ORAL | 0 refills | Status: DC

## 2020-06-23 MED ORDER — CETIRIZINE HCL 10 MG PO TABS
10.0000 mg | ORAL_TABLET | Freq: Every day | ORAL | 0 refills | Status: DC
Start: 2020-06-23 — End: 2020-06-27

## 2020-06-27 ENCOUNTER — Other Ambulatory Visit: Payer: Self-pay

## 2020-06-27 DIAGNOSIS — F902 Attention-deficit hyperactivity disorder, combined type: Secondary | ICD-10-CM

## 2020-06-27 MED ORDER — LISDEXAMFETAMINE DIMESYLATE 40 MG PO CAPS: 40.0000 mg | ORAL_CAPSULE | ORAL | 0 refills | Status: DC

## 2020-06-27 MED ORDER — CETIRIZINE HCL 10 MG PO TABS: 10.0000 mg | ORAL_TABLET | Freq: Every day | ORAL | 0 refills | Status: DC

## 2020-07-20 ENCOUNTER — Other Ambulatory Visit: Payer: Self-pay | Admitting: Pediatrics

## 2020-07-27 ENCOUNTER — Other Ambulatory Visit: Payer: Self-pay | Admitting: Pediatrics

## 2020-08-01 ENCOUNTER — Other Ambulatory Visit: Payer: Self-pay | Admitting: Family

## 2020-08-01 DIAGNOSIS — F4323 Adjustment disorder with mixed anxiety and depressed mood: Secondary | ICD-10-CM

## 2020-08-12 ENCOUNTER — Other Ambulatory Visit: Payer: Self-pay | Admitting: Family

## 2020-08-12 DIAGNOSIS — F902 Attention-deficit hyperactivity disorder, combined type: Secondary | ICD-10-CM

## 2020-08-13 ENCOUNTER — Other Ambulatory Visit: Payer: Self-pay | Admitting: Family

## 2020-08-13 DIAGNOSIS — F902 Attention-deficit hyperactivity disorder, combined type: Secondary | ICD-10-CM

## 2020-08-15 MED ORDER — LISDEXAMFETAMINE DIMESYLATE 40 MG PO CAPS: 40.0000 mg | ORAL_CAPSULE | ORAL | 0 refills | Status: DC

## 2020-09-29 ENCOUNTER — Encounter: Payer: Self-pay | Admitting: Family

## 2020-10-11 ENCOUNTER — Other Ambulatory Visit: Payer: Self-pay | Admitting: Family

## 2020-10-11 DIAGNOSIS — F902 Attention-deficit hyperactivity disorder, combined type: Secondary | ICD-10-CM

## 2020-10-11 MED ORDER — LISDEXAMFETAMINE DIMESYLATE 40 MG PO CAPS: 40.0000 mg | ORAL_CAPSULE | ORAL | 0 refills | Status: DC

## 2020-10-28 ENCOUNTER — Encounter: Payer: Self-pay | Admitting: Family

## 2020-11-22 ENCOUNTER — Other Ambulatory Visit (HOSPITAL_COMMUNITY)
Admission: RE | Admit: 2020-11-22 | Discharge: 2020-11-22 | Disposition: A | Discharge status: Home / Self Care | Payer: Medicaid Other | Source: Ambulatory Visit | Attending: Pediatrics | Admitting: Pediatrics

## 2020-11-22 ENCOUNTER — Ambulatory Visit (INDEPENDENT_AMBULATORY_CARE_PROVIDER_SITE_OTHER): Payer: Medicaid Other | Admitting: Family

## 2020-11-22 ENCOUNTER — Other Ambulatory Visit: Payer: Self-pay

## 2020-11-22 ENCOUNTER — Encounter: Payer: Self-pay | Admitting: Pediatrics

## 2020-11-22 VITALS — BP 132/94 | HR 85 | Ht 62.21 in | Wt 101.8 lb

## 2020-11-22 DIAGNOSIS — Z30017 Encounter for initial prescription of implantable subdermal contraceptive: Secondary | ICD-10-CM | POA: Diagnosis not present

## 2020-11-22 DIAGNOSIS — Z3046 Encounter for surveillance of implantable subdermal contraceptive: Secondary | ICD-10-CM

## 2020-11-22 DIAGNOSIS — Z113 Encounter for screening for infections with a predominantly sexual mode of transmission: Secondary | ICD-10-CM | POA: Diagnosis not present

## 2020-11-22 MED ORDER — ETONOGESTREL 68 MG ~~LOC~~ IMPL
68.0000 mg | DRUG_IMPLANT | Freq: Once | SUBCUTANEOUS | Status: AC
Start: 2020-11-22 — End: 2020-11-22
  Administered 2020-11-22: 68 mg via SUBCUTANEOUS

## 2020-11-22 NOTE — Progress Notes (Signed)
History was provided by the patient.  Gail Reed is a 20 y.o. female who is here for nexplanon removal and replacement.   PCP confirmed? YesArmandina Stammer, MD  HPI:   -Nexplanon since 10/22/2017 -would like removal and replacement  -had breakthrough bleeding with implant which resolved with COCs  Acute issue:  -BP elevated today; sometimes nicotine vaping - none today or yesterday  -no associated headache, chest pain, no SOB, no n/v  -Vyvanse 40 mg with benefit; wore heart monitor to assess tachycardia; will schedule a virtual visit to discuss ADHD/med at later date     Patient Active Problem List   Diagnosis Date Noted  . Sleep deficient 10/28/2019  . Acne 10/28/2019  . Decreased appetite 10/28/2019  . Sinus tachycardia 09/25/2018  . Abnormal EKG 09/25/2018  . Elevated BP without diagnosis of hypertension 04/28/2018  . 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 Nexplanon in place 11/18/2017  . Attention deficit hyperactivity disorder (ADHD), 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  . Adjustment disorder 10/22/2017    Current Outpatient Medications on File Prior to Visit  Medication Sig Dispense Refill  . clindamycin-benzoyl peroxide (BENZACLIN) gel 1 (ONE) APPLICATION TO AFFECTED AREA NIGHTLY 25 g 6  . lisdexamfetamine (VYVANSE) 40 MG capsule Take 1 capsule (40 mg total) by mouth every morning. 30 capsule 0  . minocycline (MINOCIN) 100 MG capsule TAKE 1 CAPSULE BY MOUTH TWICE A DAY 60 capsule 3  . sertraline (ZOLOFT) 50 MG tablet TAKE 1 TABLET BY MOUTH EVERY DAY 90 tablet 0  . cetirizine (ZYRTEC) 10 MG tablet TAKE 1 TABLET BY MOUTH EVERY DAY (Patient not taking: Reported on 11/22/2020) 90 tablet 1  . JUNEL 1.5/30 1.5-30 MG-MCG tablet TAKE 1 TABLET BY MOUTH EVERY DAY (Patient not taking: Reported on 11/22/2020) 21 tablet 3   No current facility-administered medications on file prior to visit.    Allergies  Allergen Reactions  . Amoxicillin Rash    AS A CHILD    Physical  Exam:    Vitals:   11/22/20 1053 11/22/20 1150  BP: (!) 147/101 (!) 132/94  Pulse: 85   Weight: 101 lb 12.8 oz (46.2 kg)   Height: 5' 2.21" (1.58 m)     Wt Readings from Last 3 Encounters:  11/22/20 101 lb 12.8 oz (46.2 kg)  10/10/18 97 lb 6.4 oz (44.2 kg) (2 %, Z= -1.98)*  09/19/18 100 lb (45.4 kg) (4 %, Z= -1.73)*   * Growth percentiles are based on CDC (Girls, 2-20 Years) data.    Growth percentile SmartLinks can only be used for patients less than 27 years old. No LMP recorded. Patient has had an implant.  Physical Exam   Assessment/Plan: 1. Encounter for Nexplanon removal Risks & benefits of Nexplanon removal discussed. Consent form signed.  The patient denies any allergies to anesthetics or antiseptics.  Procedure: Pt was placed in supine position. left arm was flexed at the elbow and externally rotated so that her wrist was parallel to her ear, The device was palpated and marked. The site was cleaned with Betadine. The area surrounding the device was covered with a sterile drape. 1% lidocaine was injected just under the device. A scalpel was used to create a small incision. The device was pushed towards the incision. Fibrous tissue surrounding the device was gradually removed from the device. The device was removed and measured to ensure all 4 cm of device was removed. Steri-strips were used to close the incision. Pressure dressing was applied to  the patient.  The patient was instructed to removed the pressure dressing in 24 hrs.  The patient was advised to move slowly from a supine to an upright position  The patient denied any concerns or complaints  The patient was instructed to schedule a follow-up appt in 1 month. The patient will be called in 1 week to address any concerns.  2. Insertion of Nexplanon See procedure note  - Subdermal Etonogestrel Implant Insertion - etonogestrel (NEXPLANON) implant 68 mg   Repeat BP improved. Scheduled for ADHD  follow up with Candida Peeling, FNP-C tomorrow 3:30.

## 2020-11-22 NOTE — Procedures (Signed)
Nexplanon Insertion  No contraindications for placement.  No liver disease, no unexplained vaginal bleeding, no h/o breast cancer, no h/o blood clots.  No LMP recorded. Patient has had an implant.  UHCG: NA  Last Unprotected sex:  NA  Risks & benefits of Nexplanon discussed The nexplanon device was purchased and supplied by [facility]. Packaging instructions supplied to patient Consent form signed  The patient denies any allergies to anesthetics or antiseptics.  Procedure: Pt was placed in supine position. The left arm was flexed at the elbow and externally rotated so that wrist was parallel to ear The medial epicondyle of the left arm was identified The insertions site was marked 8 cm proximal to the medial epicondyle The insertion site was cleaned with Betadine The area surrounding the insertion site was covered with a sterile drape 1% lidocaine was injected just under the skin at the insertion site extending 4 cm proximally. The sterile preloaded disposable Nexaplanon applicator was removed from the sterile packaging The applicator needle was inserted at a 30 degree angle at 8 cm proximal to the medial epicondyle as marked The applicator was lowered to a horizontal position and advanced just under the skin for the full length of the needle The slider on the applicator was retracted fully while the applicator remained in the same position, then the applicator was removed. The implant was confirmed via palpation as being in position The implant position was demonstrated to the patient Pressure dressing was applied to the patient.  The patient was instructed to removed the pressure dressing in 24 hrs.  The patient was advised to move slowly from a supine to an upright position  The patient denied any concerns or complaints  The patient was instructed to schedule a follow-up appt in 1 month and to call sooner if any concerns.  The patient acknowledged agreement and understanding of  the plan.

## 2020-11-22 NOTE — Addendum Note (Signed)
Addended by: Debroah Loop on: 11/22/2020 02:54 PM   Modules accepted: Orders

## 2020-11-23 ENCOUNTER — Telehealth (INDEPENDENT_AMBULATORY_CARE_PROVIDER_SITE_OTHER): Payer: Medicaid Other | Admitting: Pediatrics

## 2020-11-23 DIAGNOSIS — F4323 Adjustment disorder with mixed anxiety and depressed mood: Secondary | ICD-10-CM | POA: Diagnosis not present

## 2020-11-23 DIAGNOSIS — M25521 Pain in right elbow: Secondary | ICD-10-CM

## 2020-11-23 DIAGNOSIS — M25542 Pain in joints of left hand: Secondary | ICD-10-CM

## 2020-11-23 DIAGNOSIS — M25531 Pain in right wrist: Secondary | ICD-10-CM

## 2020-11-23 DIAGNOSIS — R030 Elevated blood-pressure reading, without diagnosis of hypertension: Secondary | ICD-10-CM

## 2020-11-23 DIAGNOSIS — M25522 Pain in left elbow: Secondary | ICD-10-CM

## 2020-11-23 DIAGNOSIS — M25541 Pain in joints of right hand: Secondary | ICD-10-CM

## 2020-11-23 DIAGNOSIS — F902 Attention-deficit hyperactivity disorder, combined type: Secondary | ICD-10-CM

## 2020-11-23 DIAGNOSIS — M25532 Pain in left wrist: Secondary | ICD-10-CM

## 2020-11-23 LAB — URINE CYTOLOGY ANCILLARY ONLY
Chlamydia: NEGATIVE
Comment: NEGATIVE
Comment: NORMAL
Neisseria Gonorrhea: NEGATIVE

## 2020-11-23 MED ORDER — METHYLPHENIDATE HCL ER (XR) 10 MG PO CP24: 10.0000 mg | ORAL_CAPSULE | Freq: Every day | ORAL | 0 refills | Status: DC

## 2020-11-23 NOTE — Progress Notes (Signed)
THIS RECORD MAY CONTAIN CONFIDENTIAL INFORMATION THAT SHOULD NOT BE RELEASED WITHOUT REVIEW OF THE SERVICE PROVIDER.  Virtual Follow-Up Visit via Video Note  I connected with Gail Reed 's patient  on 11/23/20 at  3:30 PM EST by a video enabled telemedicine application and verified that I am speaking with the correct person using two identifiers.   Patient/parent location: Home   I discussed the limitations of evaluation and management by telemedicine and the availability of in person appointments.  I discussed that the purpose of this telehealth visit is to provide medical care while limiting exposure to the novel coronavirus.  The patient expressed understanding and agreed to proceed.   Gail Reed is a 20 y.o. female referred by Marcelina Morel, MD here today for follow-up of ADHD, anxiety, depression.  Previsit planning completed:  yes   History was provided by the patient.  Plan from Last Visit:   Continue vyvanse, sertraline   Chief Complaint: Med f/u  History of Present Illness:  Took vyvanse yesterday. BP was high.  ADHD is ok with the vyvanse, but can't really function without the medication. She feels like it is harder to talk to others and be social when taking it.   Without meds, she sleeps all day, eats a lot, is not productive.   Says school "could be going better"- missing classes sometimes and forgets to turn in her work. Distracted doing other things.   Has taken dextrostat, concerta. Can't remember the effects of these. Has not taken adderall.   Has been on and off zoloft because it is hard to remember. Sometimes she gets really sad and overwhelmed with her emotions, lack of motivation. Does think the zoloft helps when she takes it consistently.   Sleeping well, though stays up late.   Trying to stop vaping as she doesn't want to get wrinkles.   When she wakes up she has wrist pain and they hurt. Some elbow pain as well as pain in her finger  joints. This happens most days. Two grandmothers with arthritis.    Allergies  Allergen Reactions  . Amoxicillin Rash    AS A CHILD   Outpatient Medications Prior to Visit  Medication Sig Dispense Refill  . cetirizine (ZYRTEC) 10 MG tablet TAKE 1 TABLET BY MOUTH EVERY DAY (Patient not taking: Reported on 11/22/2020) 90 tablet 1  . clindamycin-benzoyl peroxide (BENZACLIN) gel 1 (ONE) APPLICATION TO AFFECTED AREA NIGHTLY 25 g 6  . JUNEL 1.5/30 1.5-30 MG-MCG tablet TAKE 1 TABLET BY MOUTH EVERY DAY (Patient not taking: Reported on 11/22/2020) 21 tablet 3  . lisdexamfetamine (VYVANSE) 40 MG capsule Take 1 capsule (40 mg total) by mouth every morning. 30 capsule 0  . minocycline (MINOCIN) 100 MG capsule TAKE 1 CAPSULE BY MOUTH TWICE A DAY 60 capsule 3  . sertraline (ZOLOFT) 50 MG tablet TAKE 1 TABLET BY MOUTH EVERY DAY 90 tablet 0   No facility-administered medications prior to visit.     Patient Active Problem List   Diagnosis Date Noted  . Sleep deficient 10/28/2019  . Acne 10/28/2019  . Decreased appetite 10/28/2019  . Sinus tachycardia 09/25/2018  . Abnormal EKG 09/25/2018  . Elevated BP without diagnosis of hypertension 04/28/2018  . 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 Nexplanon in place 11/18/2017  . Attention deficit hyperactivity disorder (ADHD), 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  . Adjustment disorder 10/22/2017    The following portions of the patient's history were reviewed and updated as appropriate: allergies, current medications, past  family history, past medical history, past social history, past surgical history and problem list.  Visual Observations/Objective:   General Appearance: Well nourished well developed, in no apparent distress.  Eyes: conjunctiva no swelling or erythema ENT/Mouth: No hoarseness, No cough for duration of visit.  Neck: Supple  Respiratory: Respiratory effort normal, normal rate, no retractions or distress.   Cardio: Appears well-perfused,  noncyanotic Musculoskeletal: no obvious deformity Skin: visible skin without rashes, ecchymosis, erythema Neuro: Awake and oriented X 3,  Psych:  normal affect, Insight and Judgment appropriate.    Assessment/Plan: 1. Attention deficit hyperactivity disorder (ADHD), combined type Will switch to a different medication given that her ADHD is poorly controlled but she is having some side effects with the vyvanse.  - Methylphenidate HCl ER, XR, (APTENSIO XR) 10 MG CP24; Take 10 mg by mouth daily.  Dispense: 30 capsule; Refill: 0  2. Elevated BP without diagnosis of hypertension Has been seen by cardiology and was cleared. BP continues to be elevated during her most recent office visit.   3. Adjustment disorder with mixed anxiety and depressed mood Continue sertraline- be more consistent.   4. Joint pain in fingers of both hands, wrists and elbows Given symmetric joint pain with involvement of hands and ongoing unexplained hypertension, will have pt back to the office for exam and labs to screen for systemic illness like lupus. Does have a fam hx of autoimmune illness.   CMP, CBC, ESR, CRP, ANA, antiDSDNA, antiphospholipid antibodies. Consider renal ultrasound pending labs.   [place] screenings:  PHQ-SADS Last 3 Score only 04/28/2018 11/05/2017 10/22/2017  PHQ-15 Score 3 2 3   Total GAD-7 Score 0 2 4  PHQ-9 Total Score 1 4 5     Screens discussed with patient and parent and adjustments to plan made accordingly.   I discussed the assessment and treatment plan with the patient and/or parent/guardian.  They were provided an opportunity to ask questions and all were answered.  They agreed with the plan and demonstrated an understanding of the instructions. They were advised to call back or seek an in-person evaluation in the emergency room if the symptoms worsen or if the condition fails to improve as anticipated.   Follow-up:  Next week in clinic   Medical decision-making:   I spent 25 minutes  on this telehealth visit inclusive of face-to-face video and care coordination time I was located off site during this encounter.   Jonathon Resides, FNP    CC: Marcelina Morel, MD, Marcelina Morel, MD

## 2020-11-29 ENCOUNTER — Ambulatory Visit: Payer: Self-pay | Admitting: Pediatrics

## 2020-12-05 ENCOUNTER — Ambulatory Visit: Payer: Medicaid Other | Admitting: Pediatrics

## 2020-12-06 ENCOUNTER — Encounter: Payer: Self-pay | Admitting: Pediatrics

## 2020-12-06 ENCOUNTER — Encounter: Payer: Self-pay | Admitting: Family

## 2020-12-06 ENCOUNTER — Other Ambulatory Visit: Payer: Self-pay

## 2020-12-06 ENCOUNTER — Ambulatory Visit (INDEPENDENT_AMBULATORY_CARE_PROVIDER_SITE_OTHER): Payer: Medicaid Other | Admitting: Pediatrics

## 2020-12-06 VITALS — BP 118/90 | HR 79 | Ht 62.4 in | Wt 98.6 lb

## 2020-12-06 DIAGNOSIS — M25542 Pain in joints of left hand: Secondary | ICD-10-CM

## 2020-12-06 DIAGNOSIS — M25521 Pain in right elbow: Secondary | ICD-10-CM

## 2020-12-06 DIAGNOSIS — F902 Attention-deficit hyperactivity disorder, combined type: Secondary | ICD-10-CM

## 2020-12-06 DIAGNOSIS — M25532 Pain in left wrist: Secondary | ICD-10-CM

## 2020-12-06 DIAGNOSIS — M25541 Pain in joints of right hand: Secondary | ICD-10-CM

## 2020-12-06 DIAGNOSIS — M25531 Pain in right wrist: Secondary | ICD-10-CM

## 2020-12-06 DIAGNOSIS — R030 Elevated blood-pressure reading, without diagnosis of hypertension: Secondary | ICD-10-CM | POA: Diagnosis not present

## 2020-12-06 DIAGNOSIS — M25522 Pain in left elbow: Secondary | ICD-10-CM

## 2020-12-06 DIAGNOSIS — F4323 Adjustment disorder with mixed anxiety and depressed mood: Secondary | ICD-10-CM

## 2020-12-06 MED ORDER — NORETHIN ACE-ETH ESTRAD-FE 1-20 MG-MCG PO TABS: 1.0000 | ORAL_TABLET | Freq: Every day | ORAL | 11 refills | Status: DC

## 2020-12-06 MED ORDER — SERTRALINE HCL 100 MG PO TABS: 100.0000 mg | ORAL_TABLET | Freq: Every day | ORAL | 1 refills | Status: DC

## 2020-12-06 MED ORDER — METHYLPHENIDATE HCL ER (XR) 15 MG PO CP24: 15.0000 mg | ORAL_CAPSULE | Freq: Every day | ORAL | 0 refills | Status: DC

## 2020-12-06 NOTE — Patient Instructions (Signed)
Labs today  Increase aptensio to 15 mg daily  Increase sertraline to 100 mg daily  Start birth control pill for breakthrough bleeding

## 2020-12-06 NOTE — Progress Notes (Signed)
History was provided by the patient.  Gail Reed is a 20 y.o. female who is here for ADHD, adjustment disorder, hypertension  Keiffer, [name], MD   HPI:  Pt reports that she has been ok. The new ADHD medication she was still feeling sleepy and sleeping a lot during the day. Took two one day and was way over-focused on the wrong things like [employer]. She had a performance the other day and had just taken one aptensio and did very well.   She is trying to eat more. She sometimes forgets to eat. She tries to eat breakfast, lunch and dinner, but does not eat snacks. When she doesn't take her medication, she does eat a good bit more.   She did take her stimulant this morning.   Continues to have ongoing joint pain in fingers, wrists and elbows. Has also been having some issues in her knees and ankles. Denies rash. Continued fatigue.   No LMP recorded. Patient has had an implant.  Review of Systems  Constitutional: Negative for malaise/fatigue.  Eyes: Negative for double vision.  Respiratory: Negative for shortness of breath.   Cardiovascular: Negative for chest pain and palpitations.  Gastrointestinal: Negative for abdominal pain, constipation, diarrhea, nausea and vomiting.  Genitourinary: Negative for dysuria.  Musculoskeletal: Negative for joint pain and myalgias.  Skin: Negative for rash.  Neurological: Negative for dizziness and headaches.  Endo/Heme/Allergies: Does not bruise/bleed easily.  Psychiatric/Behavioral: Positive for depression. The patient is nervous/anxious. The patient does not have insomnia.     Patient Active Problem List   Diagnosis Date Noted  . Sleep deficient 10/28/2019  . Acne 10/28/2019  . Decreased appetite 10/28/2019  . Sinus tachycardia 09/25/2018  . Abnormal EKG 09/25/2018  . Elevated BP without diagnosis of hypertension 04/28/2018  . 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 Nexplanon in place 11/18/2017  . Attention deficit hyperactivity  disorder (ADHD), 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  . Adjustment disorder 10/22/2017    Current Outpatient Medications on File Prior to Visit  Medication Sig Dispense Refill  . clindamycin-benzoyl peroxide (BENZACLIN) gel 1 (ONE) APPLICATION TO AFFECTED AREA NIGHTLY 25 g 6  . Methylphenidate HCl ER, XR, (APTENSIO XR) 10 MG CP24 Take 10 mg by mouth daily. 30 capsule 0  . minocycline (MINOCIN) 100 MG capsule TAKE 1 CAPSULE BY MOUTH TWICE A DAY 60 capsule 3  . sertraline (ZOLOFT) 50 MG tablet TAKE 1 TABLET BY MOUTH EVERY DAY 90 tablet 0  . lisdexamfetamine (VYVANSE) 40 MG capsule Take 1 capsule (40 mg total) by mouth every morning. (Patient not taking: Reported on 12/06/2020) 30 capsule 0   No current facility-administered medications on file prior to visit.    Allergies  Allergen Reactions  . Amoxicillin Rash    AS A CHILD     Physical Exam:    Vitals:   12/06/20 1003  BP: 118/90  Pulse: 79  Weight: 98 lb 9.6 oz (44.7 kg)  Height: 5' 2.4" (1.585 m)    Growth percentile SmartLinks can only be used for patients less than 54 years old.  Physical Exam Constitutional:      Appearance: She is well-developed.  HENT:     Head: Normocephalic.  Neck:     Thyroid: No thyromegaly.  Cardiovascular:     Rate and Rhythm: Normal rate and regular rhythm.     Heart sounds: Normal heart sounds.  Pulmonary:     Effort: Pulmonary effort is normal.     Breath sounds: Normal breath sounds.  Abdominal:     General: Bowel sounds are normal.     Palpations: Abdomen is soft.     Tenderness: There is no abdominal tenderness.  Musculoskeletal:        General: Normal range of motion.  Skin:    General: Skin is warm and dry.     Capillary Refill: Capillary refill takes less than 2 seconds.  Neurological:     Mental Status: She is alert and oriented to person, place, and time.  Psychiatric:        Mood and Affect: Mood normal.        Behavior: Behavior normal.     Assessment/Plan: 1. Elevated BP  without diagnosis of hypertension BP is better in clinic today with change in stimulant, though diastolic is still higher than I would expect in someone her age and physical conditioning. Will get labs as below to assess further.  - Comprehensive metabolic panel - CBC with Differential/Platelet - Sedimentation rate - C-reactive protein - ANA,IFA RA Diag Pnl w/rflx Tit/Patn - Anti-DNA antibody, double-stranded  2. Joint pain in fingers of both hands/wrists/elbows/knees/ankles Joint pain continues. Given discussion in last visit, labs were obtained.  - Comprehensive metabolic panel - CBC with Differential/Platelet - Sedimentation rate - C-reactive protein - ANA,IFA RA Diag Pnl w/rflx Tit/Patn - Anti-DNA antibody, double-stranded  5. Attention deficit hyperactivity disorder (ADHD), combined type Will increase aptensio dose slightly and monitor.  - Methylphenidate HCl ER, XR, (APTENSIO XR) 15 MG CP24; Take 15 mg by mouth daily.  Dispense: 30 capsule; Refill: 0  6. Adjustment disorder with mixed anxiety and depressed mood Some of her fatigue and issues with motivation may be attributable to some ongoing mood symptoms. We discussed increasing sertraline today and monitoring. Also discussed considering wellbutrin in the future.  - sertraline (ZOLOFT) 100 MG tablet; Take 1 tablet (100 mg total) by mouth daily.  Dispense: 90 tablet; Refill: 1 - VITAMIN D 25 Hydroxy (Vit-D Deficiency, Fractures) - Ferritin - TSH  Return in 4 weeks or sooner pending labs  Alfonso Ramus, FNP

## 2020-12-07 LAB — VITAMIN D 25 HYDROXY (VIT D DEFICIENCY, FRACTURES): Vit D, 25-Hydroxy: 19 ng/mL — ABNORMAL LOW (ref 30–100)

## 2020-12-07 LAB — FERRITIN: Ferritin: 11 ng/mL — ABNORMAL LOW (ref 16–154)

## 2020-12-07 LAB — TSH: TSH: 1.77 mIU/L

## 2020-12-08 LAB — COMPREHENSIVE METABOLIC PANEL
AG Ratio: 1.6 (calc) (ref 1.0–2.5)
ALT: 10 U/L (ref 6–29)
AST: 15 U/L (ref 10–30)
Albumin: 4.1 g/dL (ref 3.6–5.1)
Alkaline phosphatase (APISO): 73 U/L (ref 31–125)
BUN: 13 mg/dL (ref 7–25)
CO2: 25 mmol/L (ref 20–32)
Calcium: 9.6 mg/dL (ref 8.6–10.2)
Chloride: 103 mmol/L (ref 98–110)
Creat: 0.82 mg/dL (ref 0.50–1.10)
Globulin: 2.5 g/dL (calc) (ref 1.9–3.7)
Glucose, Bld: 84 mg/dL (ref 65–99)
Potassium: 4.9 mmol/L (ref 3.5–5.3)
Sodium: 140 mmol/L (ref 135–146)
Total Bilirubin: 0.4 mg/dL (ref 0.2–1.2)
Total Protein: 6.6 g/dL (ref 6.1–8.1)

## 2020-12-08 LAB — CBC WITH DIFFERENTIAL/PLATELET
Absolute Monocytes: 312 cells/uL (ref 200–950)
Basophils Absolute: 39 cells/uL (ref 0–200)
Basophils Relative: 0.6 %
Eosinophils Absolute: 91 cells/uL (ref 15–500)
Eosinophils Relative: 1.4 %
HCT: 40.9 % (ref 35.0–45.0)
Hemoglobin: 13.7 g/dL (ref 11.7–15.5)
Lymphs Abs: 2015 cells/uL (ref 850–3900)
MCH: 30 pg (ref 27.0–33.0)
MCHC: 33.5 g/dL (ref 32.0–36.0)
MCV: 89.5 fL (ref 80.0–100.0)
MPV: 9.8 fL (ref 7.5–12.5)
Monocytes Relative: 4.8 %
Neutro Abs: 4043 cells/uL (ref 1500–7800)
Neutrophils Relative %: 62.2 %
Platelets: 319 10*3/uL (ref 140–400)
RBC: 4.57 10*6/uL (ref 3.80–5.10)
RDW: 12.2 % (ref 11.0–15.0)
Total Lymphocyte: 31 %
WBC: 6.5 10*3/uL (ref 3.8–10.8)

## 2020-12-08 LAB — ANA,IFA RA DIAG PNL W/RFLX TIT/PATN
Anti Nuclear Antibody (ANA): NEGATIVE
Cyclic Citrullin Peptide Ab: <16 UNITS
Rheumatoid fact SerPl-aCnc: <14 IU/mL (ref <14)

## 2020-12-08 LAB — SEDIMENTATION RATE: Sed Rate: 2 mm/h (ref 0–20)

## 2020-12-08 LAB — C-REACTIVE PROTEIN: CRP: 0.6 mg/L (ref <8.0)

## 2020-12-08 LAB — ANTI-DNA ANTIBODY, DOUBLE-STRANDED: ds DNA Ab: <1 IU/mL

## 2020-12-12 ENCOUNTER — Other Ambulatory Visit: Payer: Self-pay | Admitting: Pediatrics

## 2020-12-12 MED ORDER — FERROUS FUMARATE 324 (106 FE) MG PO TABS: 1.0000 | ORAL_TABLET | Freq: Every day | ORAL | 3 refills | Status: DC

## 2020-12-12 MED ORDER — VITAMIN D (ERGOCALCIFEROL) 1.25 MG (50000 UNIT) PO CAPS: 50000.0000 [IU] | ORAL_CAPSULE | ORAL | 0 refills | Status: DC

## 2020-12-13 ENCOUNTER — Encounter: Payer: Self-pay | Admitting: Pediatrics

## 2021-01-02 ENCOUNTER — Other Ambulatory Visit: Payer: Self-pay | Admitting: Pediatrics

## 2021-01-03 ENCOUNTER — Other Ambulatory Visit: Payer: Self-pay

## 2021-01-03 ENCOUNTER — Encounter: Payer: Self-pay | Admitting: Pediatrics

## 2021-01-03 ENCOUNTER — Ambulatory Visit (INDEPENDENT_AMBULATORY_CARE_PROVIDER_SITE_OTHER): Payer: Medicaid Other | Admitting: Pediatrics

## 2021-01-03 VITALS — BP 140/98 | HR 100 | Temp 99.7°F | Ht 62.0 in | Wt 103.8 lb

## 2021-01-03 DIAGNOSIS — F902 Attention-deficit hyperactivity disorder, combined type: Secondary | ICD-10-CM | POA: Diagnosis not present

## 2021-01-03 DIAGNOSIS — L709 Acne, unspecified: Secondary | ICD-10-CM

## 2021-01-03 DIAGNOSIS — R590 Localized enlarged lymph nodes: Secondary | ICD-10-CM

## 2021-01-03 DIAGNOSIS — F4323 Adjustment disorder with mixed anxiety and depressed mood: Secondary | ICD-10-CM

## 2021-01-03 DIAGNOSIS — J029 Acute pharyngitis, unspecified: Secondary | ICD-10-CM

## 2021-01-03 MED ORDER — DULOXETINE HCL 20 MG PO CPEP
ORAL_CAPSULE | ORAL | 1 refills | Status: DC
Start: 2021-01-03 — End: 2021-01-27

## 2021-01-03 MED ORDER — METHYLPHENIDATE HCL ER (XR) 15 MG PO CP24: 15.0000 mg | ORAL_CAPSULE | Freq: Every day | ORAL | 0 refills | Status: DC

## 2021-01-03 MED ORDER — MINOCYCLINE HCL 100 MG PO CAPS: 100.0000 mg | ORAL_CAPSULE | Freq: Two times a day (BID) | ORAL | 3 refills | Status: DC

## 2021-01-03 NOTE — Patient Instructions (Addendum)
Continue aptensio xr 15 mg  Switch from sertraline to cymbalta-  Week 1: decrease sertraline to 25 mg, start cymbalta 20 mg  Week 2: stop sertraline, continue cymbalta 20 mg  Week 3: increase cymbalta to 40 mg   Increase fluid intake to at least 50 oz a day of water. If you are having caffeine, for each cup, you should have additional water    Lymphadenopathy  Lymphadenopathy means that your lymph glands are swollen or larger than normal. Lymph glands, also called lymph nodes, are collections of tissue that filter excess fluid, bacteria, viruses, and waste from your bloodstream. They are part of your body's disease-fighting system (immune system), which protects your body from germs. There may be different causes of lymphadenopathy, depending on where it is in your body. Some types go away on their own. Lymphadenopathy can occur anywhere that you have lymph glands, including these areas:  Neck (cervical lymphadenopathy).  Chest (mediastinal lymphadenopathy).  Lungs (hilar lymphadenopathy).  Underarms (axillary lymphadenopathy).  Groin (inguinal lymphadenopathy). When your immune system responds to germs, infection-fighting cells and fluid build up in your lymph glands. This causes some swelling and enlargement. If the lymph nodes do not go back to normal size after you have an infection or disease, your health care provider may do tests. These tests help to monitor your condition and find the reason why the glands are still swollen and enlarged. Follow these instructions at home:  Get plenty of rest.  Your health care provider may recommend over-the-counter medicines for pain. Take over-the-counter and prescription medicines only as told by your health care provider.  If directed, apply heat to swollen lymph glands as often as told by your health care provider. Use the heat source that your health care provider recommends, such as a moist heat pack or a heating pad. ? Place a towel  between your skin and the heat source. ? Leave the heat on for 20-30 minutes. ? Remove the heat if your skin turns bright red. This is especially important if you are unable to feel pain, heat, or cold. You may have a greater risk of getting burned.  Check your affected lymph glands every day for changes. Check other lymph gland areas as told by your health care provider. Check for changes such as: ? More swelling. ? Sudden increase in size. ? Redness or pain. ? Hardness.  Keep all follow-up visits. This is important.   Contact a health care provider if you have:  Lymph glands that: ? Are still swollen after 2 weeks. ? Have suddenly gotten bigger or the swelling spreads. ? Are red, painful, or hard.  Fluid leaking from the skin near an enlarged lymph gland.  Problems with breathing.  A fever, chills, or night sweats.  Fatigue.  A sore throat.  Pain in your abdomen.  Weight loss. Get help right away if you have:  Severe pain.  Chest pain.  Shortness of breath. These symptoms may represent a serious problem that is an emergency. Do not wait to see if the symptoms will go away. Get medical help right away. Call your local emergency services (911 in the [place] Do not drive yourself to the hospital. Summary  Lymphadenopathy means that your lymph glands are swollen or larger than normal.  Lymph glands, also called lymph nodes, are collections of tissue that filter excess fluid, bacteria, viruses, and waste from the bloodstream. They are part of your body's disease-fighting system (immune system).  Lymphadenopathy can occur anywhere that  you have lymph glands.  If the lymph nodes do not go back to normal size after you have an infection or disease, your health care provider may do tests to monitor your condition and find the reason why the glands are still swollen and enlarged.  Check your affected lymph glands every day for changes. Check other lymph gland areas as told by  your health care provider. This information is not intended to replace advice given to you by your health care provider. Make sure you discuss any questions you have with your health care provider. Document Revised: 06/29/2020 Document Reviewed: 06/29/2020 Elsevier Patient Education  2021 [employer].
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
## 2021-01-06 LAB — EPSTEIN-BARR VIRUS VCA ANTIBODY PANEL
EBV NA IgG: 161 U/mL — ABNORMAL HIGH
EBV VCA IgG: 396 U/mL — ABNORMAL HIGH
EBV VCA IgM: 42.9 U/mL — ABNORMAL HIGH

## 2021-01-06 LAB — CMV ABS, IGG+IGM (CYTOMEGALOVIRUS)
CMV IgM: <30 AU/mL
Cytomegalovirus Ab-IgG: <0.6 U/mL

## 2021-01-06 LAB — CBC WITH DIFFERENTIAL/PLATELET
Absolute Monocytes: 382 cells/uL (ref 200–950)
Basophils Absolute: 42 cells/uL (ref 0–200)
Basophils Relative: 0.5 %
Eosinophils Absolute: 91 cells/uL (ref 15–500)
Eosinophils Relative: 1.1 %
HCT: 41 % (ref 35.0–45.0)
Hemoglobin: 13.7 g/dL (ref 11.7–15.5)
Lymphs Abs: 2166 cells/uL (ref 850–3900)
MCH: 29.7 pg (ref 27.0–33.0)
MCHC: 33.4 g/dL (ref 32.0–36.0)
MCV: 88.7 fL (ref 80.0–100.0)
MPV: 9.7 fL (ref 7.5–12.5)
Monocytes Relative: 4.6 %
Neutro Abs: 5619 cells/uL (ref 1500–7800)
Neutrophils Relative %: 67.7 %
Platelets: 355 10*3/uL (ref 140–400)
RBC: 4.62 10*6/uL (ref 3.80–5.10)
RDW: 12.3 % (ref 11.0–15.0)
Total Lymphocyte: 26.1 %
WBC: 8.3 10*3/uL (ref 3.8–10.8)

## 2021-01-06 LAB — HIV ANTIBODY (ROUTINE TESTING W REFLEX): HIV 1&2 Ab, 4th Generation: NONREACTIVE

## 2021-01-06 LAB — RPR: RPR Ser Ql: NONREACTIVE

## 2021-01-17 ENCOUNTER — Ambulatory Visit (INDEPENDENT_AMBULATORY_CARE_PROVIDER_SITE_OTHER): Payer: Medicaid Other | Admitting: Pediatrics

## 2021-01-17 ENCOUNTER — Other Ambulatory Visit: Payer: Self-pay

## 2021-01-17 ENCOUNTER — Encounter: Payer: Self-pay | Admitting: Pediatrics

## 2021-01-17 VITALS — BP 131/91 | HR 82 | Temp 98.8°F | Ht 62.0 in | Wt 100.8 lb

## 2021-01-17 DIAGNOSIS — F4323 Adjustment disorder with mixed anxiety and depressed mood: Secondary | ICD-10-CM | POA: Diagnosis not present

## 2021-01-17 DIAGNOSIS — R030 Elevated blood-pressure reading, without diagnosis of hypertension: Secondary | ICD-10-CM

## 2021-01-17 DIAGNOSIS — B2700 Gammaherpesviral mononucleosis without complication: Secondary | ICD-10-CM | POA: Diagnosis not present

## 2021-01-17 DIAGNOSIS — M2550 Pain in unspecified joint: Secondary | ICD-10-CM | POA: Insufficient documentation

## 2021-01-17 DIAGNOSIS — R591 Generalized enlarged lymph nodes: Secondary | ICD-10-CM

## 2021-01-17 DIAGNOSIS — R634 Abnormal weight loss: Secondary | ICD-10-CM | POA: Diagnosis not present

## 2021-01-17 DIAGNOSIS — F902 Attention-deficit hyperactivity disorder, combined type: Secondary | ICD-10-CM

## 2021-01-17 NOTE — Patient Instructions (Signed)
[facility] [phone] 

## 2021-01-17 NOTE — Progress Notes (Signed)
History was provided by the patient.  Gail Reed is a 20 y.o. female who is here for mood, ADHD, lab f/u.  [name], MD   HPI:  Pt reports that she is tired this morning. She did not get good sleep last night. She couldn't sleep related to joint pain. She has been taking ibuprofen which has been helpful but when it wears off it continues to be bothersome. Bilateral elbows, wrists, hands and finger. She does drink at times, and is unable to say definitively if this makes her pain worse. Has a history of pruritus, though none reported recently. Denies known fever at home. Denies night sweats. Reports that her fatigue is somewhat better. Continues to have sore throat. Continues with anterior and posterior cervical LAD. Has had some intermittent, non-specific chest pain in the right mediastinal area that is described as more sharp than dull and does happen on most days. Denies SOB or cough. Reports her appetite has been better and she is eating more than previously.   With medication change she hasn't noticed anything. Has been more stressed moving back in with parents.   She just started period back with nexplanon but hadn't taken birth control pills. Wonder if this is why she was emotional for no reason.   Had mono the first time when she was a small baby and then reports getting it again at age 42. She can't recall symptoms but also had concurrent salmonella.   No LMP recorded. Patient has had an implant.   Patient Active Problem List   Diagnosis Date Noted  . Sleep deficient 10/28/2019  . Acne 10/28/2019  . Decreased appetite 10/28/2019  . Sinus tachycardia 09/25/2018  . Abnormal EKG 09/25/2018  . Elevated BP without diagnosis of hypertension 04/28/2018  . 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 Nexplanon in place 11/18/2017  . Attention deficit hyperactivity disorder (ADHD), 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  . Adjustment disorder 10/22/2017    Current Outpatient Medications on  File Prior to Visit  Medication Sig Dispense Refill  . DULoxetine (CYMBALTA) 20 MG capsule Take 1 capsule (20 mg total) by mouth daily for 14 days, THEN 2 capsules (40 mg total) daily for 14 days. 42 capsule 1  . Methylphenidate HCl ER, XR, (APTENSIO XR) 15 MG CP24 Take 15 mg by mouth daily. 30 capsule 0  . minocycline (MINOCIN) 100 MG capsule Take 1 capsule (100 mg total) by mouth 2 (two) times daily. 180 capsule 3  . Vitamin D, Ergocalciferol, (DRISDOL) 1.25 MG (50000 UNIT) CAPS capsule TAKE 1 CAPSULE (50,000 UNITS TOTAL) BY MOUTH EVERY 7 (SEVEN) DAYS (Patient not taking: No sig reported) 4 capsule 1   No current facility-administered medications on file prior to visit.    Allergies  Allergen Reactions  . Amoxicillin Rash    AS A CHILD    Physical Exam:    Vitals:   01/17/21 1104 01/17/21 1106  BP: (!) 136/94 (!) 131/91  Pulse: 85 82  Weight: 100 lb 12.8 oz (45.7 kg)   Height: 5\' 2"  (1.575 m)     Growth percentile SmartLinks can only be used for patients less than 21 years old.  Physical Exam Vitals and nursing note reviewed.  Constitutional:      General: She is not in acute distress.    Appearance: She is well-developed and underweight.  HENT:     Head: Normocephalic.     Mouth/Throat:     Mouth: No oral lesions.     Dentition: Normal dentition.  Tongue: No lesions.     Palate: No mass and lesions.     Pharynx: Posterior oropharyngeal erythema present.     Tonsils: 2+ on the right. 3+ on the left.  Neck:     Thyroid: No thyromegaly.  Cardiovascular:     Rate and Rhythm: Regular rhythm. Tachycardia present.     Pulses: Normal pulses.     Heart sounds: Normal heart sounds. No murmur heard.   Pulmonary:     Breath sounds: Normal breath sounds.  Chest:  Breasts:     Left: Supraclavicular adenopathy present.    Abdominal:     Palpations: Abdomen is soft. There is splenomegaly. There is no mass.     Tenderness: There is abdominal tenderness in the left  upper quadrant. There is no guarding.  Musculoskeletal:     Right lower leg: No edema.     Left lower leg: No edema.  Lymphadenopathy:     Cervical: Cervical adenopathy present.     Right cervical: Superficial cervical adenopathy and posterior cervical adenopathy present.     Left cervical: Superficial cervical adenopathy present.     Upper Body:     Left upper body: Supraclavicular adenopathy present.  Skin:    General: Skin is warm.     Findings: No rash.  Neurological:     Mental Status: She is alert.     Comments: No tremor     Assessment/Plan: 1. 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 IgG levels significantly elevated as well. Given ongoing symptoms, LAD, weight loss and joint pain, I think it is appropriate at this time to eval with CT for concern for possible lymphoma. If CT is normal, will send to rhem for further eval of joint pain.  - Ambulatory referral to Rheumatology - CT CHEST ABDOMEN PELVIS W CONTRAST  2. Lymphadenopathy As above.  - CT CHEST ABDOMEN PELVIS W CONTRAST  3. Weight loss Weight has fluctuated in the past but reports she has been eating more. Down 3 lb since last visit.  - CT CHEST ABDOMEN PELVIS W CONTRAST  4. Adjustment disorder with mixed anxiety and depressed mood Continue cymbalta for now.   5. Attention deficit hyperactivity disorder (ADHD), combined type Continue aptensio.   6. Elevated BP without diagnosis of hypertension Elevated again today but has taken ADHD medication. Has had normal BP when she has not taken it.   Return in 2 weeks for close f/u.   Alfonso Ramus, FNP

## 2021-01-23 ENCOUNTER — Other Ambulatory Visit: Payer: Self-pay | Admitting: Pediatrics

## 2021-01-24 ENCOUNTER — Telehealth: Payer: Self-pay | Admitting: Pediatrics

## 2021-01-24 ENCOUNTER — Telehealth: Payer: Self-pay

## 2021-01-24 NOTE — Telephone Encounter (Signed)
Gail Reed from [facility] lvm requesting an authorization be completed on this patient for CT of abd and pelvis with contrast  that is scheduled for 01/26/2021. They need the authorization by today at 1 if possible so the appointment will not be canceled. Please contact Kendra at [phone] Ext. [phone].

## 2021-01-24 NOTE — Telephone Encounter (Signed)
Submitted prior auth. Awaiting response from insurance.

## 2021-01-24 NOTE — Telephone Encounter (Signed)
Submitted prior auth for CT imaging for case numbers [id number] and [id number]. Needs further review and sent chart notes to [phone]. Spoke with Enrique Sack as well and updated her on status.

## 2021-01-25 ENCOUNTER — Other Ambulatory Visit: Payer: Self-pay | Admitting: Pediatrics

## 2021-01-25 ENCOUNTER — Telehealth: Payer: Self-pay

## 2021-01-25 DIAGNOSIS — F4323 Adjustment disorder with mixed anxiety and depressed mood: Secondary | ICD-10-CM

## 2021-01-25 NOTE — Telephone Encounter (Signed)
Pt needs a prior authorization STAT for tomorrows appt. The appt will be rescheduled but they will still need the prior authorization to be sent STAT

## 2021-01-26 ENCOUNTER — Ambulatory Visit (HOSPITAL_BASED_OUTPATIENT_CLINIC_OR_DEPARTMENT_OTHER): Payer: Medicaid Other

## 2021-01-26 NOTE — Telephone Encounter (Signed)
Submitted documentation again with note to process stat

## 2021-01-27 ENCOUNTER — Other Ambulatory Visit: Payer: Self-pay | Admitting: Pediatrics

## 2021-01-27 MED ORDER — DULOXETINE HCL 20 MG PO CPEP: 40.0000 mg | ORAL_CAPSULE | Freq: Every day | ORAL | 3 refills | Status: DC

## 2021-01-27 MED ORDER — DULOXETINE HCL 40 MG PO CPEP: 40.0000 mg | ORAL_CAPSULE | Freq: Every day | ORAL | 0 refills | Status: DC

## 2021-01-31 NOTE — Telephone Encounter (Signed)
PA request not approved for both CPT codes submitted.

## 2021-02-02 ENCOUNTER — Encounter: Payer: Self-pay | Admitting: Pediatrics

## 2021-02-02 ENCOUNTER — Other Ambulatory Visit: Payer: Self-pay

## 2021-02-02 ENCOUNTER — Ambulatory Visit
Admission: RE | Admit: 2021-02-02 | Discharge: 2021-02-02 | Disposition: A | Discharge status: Home / Self Care | Payer: Medicaid Other | Source: Ambulatory Visit | Attending: Pediatrics | Admitting: Pediatrics

## 2021-02-02 ENCOUNTER — Ambulatory Visit (INDEPENDENT_AMBULATORY_CARE_PROVIDER_SITE_OTHER): Payer: Medicaid Other | Admitting: Pediatrics

## 2021-02-02 VITALS — BP 123/88 | HR 96 | Temp 98.4°F | Ht 62.21 in | Wt 101.2 lb

## 2021-02-02 DIAGNOSIS — R238 Other skin changes: Secondary | ICD-10-CM

## 2021-02-02 DIAGNOSIS — M2550 Pain in unspecified joint: Secondary | ICD-10-CM

## 2021-02-02 DIAGNOSIS — Z1389 Encounter for screening for other disorder: Secondary | ICD-10-CM

## 2021-02-02 DIAGNOSIS — R591 Generalized enlarged lymph nodes: Secondary | ICD-10-CM

## 2021-02-02 DIAGNOSIS — R030 Elevated blood-pressure reading, without diagnosis of hypertension: Secondary | ICD-10-CM | POA: Diagnosis not present

## 2021-02-02 DIAGNOSIS — Z0283 Encounter for blood-alcohol and blood-drug test: Secondary | ICD-10-CM | POA: Diagnosis not present

## 2021-02-02 DIAGNOSIS — B2700 Gammaherpesviral mononucleosis without complication: Secondary | ICD-10-CM | POA: Diagnosis not present

## 2021-02-02 DIAGNOSIS — R233 Spontaneous ecchymoses: Secondary | ICD-10-CM

## 2021-02-02 LAB — POCT URINALYSIS DIPSTICK
Bilirubin, UA: NEGATIVE
Glucose, UA: NEGATIVE
Ketones, UA: NEGATIVE
Leukocytes, UA: NEGATIVE
Nitrite, UA: NEGATIVE
Protein, UA: POSITIVE — AB
Spec Grav, UA: 1.01 (ref 1.010–1.025)
Urobilinogen, UA: NEGATIVE E.U./dL — AB
pH, UA: 6.5 (ref 5.0–8.0)

## 2021-02-02 NOTE — Progress Notes (Signed)
History was provided by the patient and mother.  Gail Reed is a 20 y.o. female who is here for lymphadenopathy, joint pain.  Gail Stammer, MD   HPI:  Pt reports that she did take her stimulant this morning around 10 am. She has not had an caffiene today. She has had some spotting with it and is spotting right now.   Still having pain in her joints- hands, fingers. Couldn't open car door the other day, bottles, pills. Right arm will get stuck at the elbow and it hurts too much to extend back out. Is having some TMJ pain on the right side and sharp pain around her ear on the left. Blurry vision but is supposed to wear glasses. Eye floaters that are not new. Does have hip pain and sharp left knee pain.   She has had some itching- had one episode of hives on her legs one year ago but has had more recent itching and rash on her bilateral forearms.   She hasn't been smoking marijuana as much but did the other night and had a really rapid heart rate and couldn't breathe well. She was finally able to calm down. She will have some random chest pain on both sides of sternum and some in ribs. Denies belly pain, n/v/d/constipation. Denies issues with urination. Appetite has been ok but doesn't get really hungry. Sleep is overall good. She has been excessively tired and lays in bed all day.   She did have COVID infection in January.   Multiple family members with thyroid disease that is autoimmune. One person with RA and one dx of T1DM recently in a female around 76.   No LMP recorded. Patient has had an implant.   Patient Active Problem List   Diagnosis Date Noted  . 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 Arthralgia 01/17/2021  . Lymphadenopathy 01/17/2021  . Weight loss 01/17/2021  . Sleep deficient 10/28/2019  . Acne 10/28/2019  . Decreased appetite 10/28/2019  . Sinus tachycardia 09/25/2018  . Abnormal EKG 09/25/2018  . Elevated BP without diagnosis  of hypertension 04/28/2018  . 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 Nexplanon in place 11/18/2017  . Attention deficit hyperactivity disorder (ADHD), 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  . Adjustment disorder 10/22/2017    Current Outpatient Medications on File Prior to Visit  Medication Sig Dispense Refill  . DULoxetine (CYMBALTA) 20 MG capsule Take 2 capsules (40 mg total) by mouth daily. 180 capsule 3  . Methylphenidate HCl ER, XR, (APTENSIO XR) 15 MG CP24 Take 15 mg by mouth daily. 30 capsule 0  . minocycline (MINOCIN) 100 MG capsule Take 1 capsule (100 mg total) by mouth 2 (two) times daily. 180 capsule 3  . Vitamin D, Ergocalciferol, (DRISDOL) 1.25 MG (50000 UNIT) CAPS capsule TAKE 1 CAPSULE (50,000 UNITS TOTAL) BY MOUTH EVERY 7 (SEVEN) DAYS (Patient not taking: Reported on 02/02/2021) 4 capsule 1   No current facility-administered medications on file prior to visit.    Allergies  Allergen Reactions  . Amoxicillin Rash    AS A CHILD    Physical Exam:    Vitals:   02/02/21 1458 02/02/21 1520  BP: (!) 152/103 123/88  Pulse: (!) 112 96  Temp: 98.4 F (36.9 C)   Weight: 101 lb 3.2 oz (45.9 kg)   Height: 5' 2.21" (1.58 m)     Growth percentile SmartLinks can only be used for patients less than 10 years old.  Physical Exam Vitals and  nursing note reviewed.  Constitutional:      General: She is not in acute distress.    Appearance: She is well-developed.  Neck:     Thyroid: No thyromegaly.  Cardiovascular:     Rate and Rhythm: Normal rate and regular rhythm.     Heart sounds: No murmur heard.   Pulmonary:     Breath sounds: Normal breath sounds.  Abdominal:     General: Bowel sounds are normal.     Palpations: Abdomen is soft. There is no hepatomegaly, splenomegaly or mass.     Tenderness: There is abdominal tenderness in the periumbilical area. There is no guarding.  Musculoskeletal:     Right lower leg: No edema.     Left lower leg: No edema.  Lymphadenopathy:      Cervical: Cervical adenopathy present.     Right cervical: Superficial cervical adenopathy present.     Left cervical: Superficial cervical adenopathy present.  Skin:    General: Skin is warm.     Findings: No rash.  Neurological:     Mental Status: She is alert.     Comments: No tremor     Assessment/Plan: 1. Elevated BP without diagnosis of hypertension Continues to have elevated BPs. Has had work-up through cardiology in the past which was normal and previous renal u/s was normal. Ultimately needs to establish with an adult pcp- internal med would be ideal.   2. 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 Splenomegaly and tenderness have resolved.   3. Lymphadenopathy Will complete workup for any sort of oncologic process. Insurance denied CT so will get cxr today with labs. If normal, will refer back to rheum.  - Lactate dehydrogenase - Uric acid - DG Chest 2 View; Future - CBC with Differential/Platelet  4. Arthralgia, unspecified joint Continues to have significant joint pain that is impairing functioning, especially in hands and wrists. Significant autoimmune hx in family. Will send back to rheum.  - Lactate dehydrogenase - Uric acid - Basic Metabolic Panel (BMET) - CBC with Differential/Platelet  6. Screening for genitourinary condition Is having light menstrual bleeding- + blood and pro.  - POCT urinalysis dipstick  Return pending rheum appt.   Alfonso Ramus, FNP

## 2021-02-02 NOTE — Patient Instructions (Signed)
Take blood pressure and heart rate once daily in the morning for the next week and send to me  Go downstairs and get chest x-ray Labs today

## 2021-02-03 LAB — LACTATE DEHYDROGENASE: LDH: 138 U/L (ref 100–200)

## 2021-02-03 LAB — CBC WITH DIFFERENTIAL/PLATELET
Absolute Monocytes: 387 cells/uL (ref 200–950)
Basophils Absolute: 26 cells/uL (ref 0–200)
Basophils Relative: 0.3 %
Eosinophils Absolute: 97 cells/uL (ref 15–500)
Eosinophils Relative: 1.1 %
HCT: 40.8 % (ref 35.0–45.0)
Hemoglobin: 13.4 g/dL (ref 11.7–15.5)
Lymphs Abs: 2006 cells/uL (ref 850–3900)
MCH: 29.1 pg (ref 27.0–33.0)
MCHC: 32.8 g/dL (ref 32.0–36.0)
MCV: 88.5 fL (ref 80.0–100.0)
MPV: 9.7 fL (ref 7.5–12.5)
Monocytes Relative: 4.4 %
Neutro Abs: 6283 cells/uL (ref 1500–7800)
Neutrophils Relative %: 71.4 %
Platelets: 324 10*3/uL (ref 140–400)
RBC: 4.61 10*6/uL (ref 3.80–5.10)
RDW: 12 % (ref 11.0–15.0)
Total Lymphocyte: 22.8 %
WBC: 8.8 10*3/uL (ref 3.8–10.8)

## 2021-02-03 LAB — BASIC METABOLIC PANEL
BUN: 19 mg/dL (ref 7–25)
CO2: 25 mmol/L (ref 20–32)
Calcium: 9.6 mg/dL (ref 8.6–10.2)
Chloride: 103 mmol/L (ref 98–110)
Creat: 0.73 mg/dL (ref 0.50–1.10)
Glucose, Bld: 73 mg/dL (ref 65–139)
Potassium: 4.3 mmol/L (ref 3.5–5.3)
Sodium: 139 mmol/L (ref 135–146)

## 2021-02-03 LAB — URIC ACID: Uric Acid, Serum: 4.6 mg/dL (ref 2.5–7.0)

## 2021-02-03 LAB — PROTIME-INR
INR: 1
Prothrombin Time: 10.2 s (ref 9.0–11.5)

## 2021-02-03 LAB — APTT: aPTT: 30 s (ref 23–32)

## 2021-02-06 ENCOUNTER — Other Ambulatory Visit: Payer: Self-pay

## 2021-02-06 DIAGNOSIS — F902 Attention-deficit hyperactivity disorder, combined type: Secondary | ICD-10-CM

## 2021-02-06 MED ORDER — METHYLPHENIDATE HCL ER (XR) 15 MG PO CP24
15.0000 mg | ORAL_CAPSULE | Freq: Every day | ORAL | 0 refills | Status: DC
Start: 2021-02-06 — End: 2021-02-16

## 2021-02-06 NOTE — Telephone Encounter (Signed)
Requested Aptensio XR.

## 2021-02-07 LAB — DRUG SCREEN, CLINICAL 1, WITHOUT CONFIRMATION, URINE: URINE RESULTS: NOT DETECTED

## 2021-02-14 ENCOUNTER — Other Ambulatory Visit: Payer: Self-pay | Admitting: Pediatrics

## 2021-02-14 DIAGNOSIS — M2550 Pain in unspecified joint: Secondary | ICD-10-CM

## 2021-02-14 DIAGNOSIS — R591 Generalized enlarged lymph nodes: Secondary | ICD-10-CM

## 2021-02-14 DIAGNOSIS — B2700 Gammaherpesviral mononucleosis without complication: Secondary | ICD-10-CM

## 2021-02-14 NOTE — Progress Notes (Signed)
Referral to rheum was viewed by provider and felt that ID referral for EBV labs was most appropriate first prior to rheum seeing. I have placed this referral.

## 2021-02-16 ENCOUNTER — Other Ambulatory Visit: Payer: Self-pay

## 2021-02-16 DIAGNOSIS — F902 Attention-deficit hyperactivity disorder, combined type: Secondary | ICD-10-CM

## 2021-02-22 ENCOUNTER — Other Ambulatory Visit: Payer: Self-pay

## 2021-02-22 ENCOUNTER — Ambulatory Visit (INDEPENDENT_AMBULATORY_CARE_PROVIDER_SITE_OTHER): Payer: Medicaid Other | Admitting: Infectious Diseases

## 2021-02-22 ENCOUNTER — Encounter: Payer: Self-pay | Admitting: Infectious Diseases

## 2021-02-22 VITALS — BP 130/90 | HR 81 | Temp 98.7°F | Wt 104.0 lb

## 2021-02-22 DIAGNOSIS — B2709 Gammaherpesviral mononucleosis with other complications: Secondary | ICD-10-CM | POA: Diagnosis not present

## 2021-02-22 DIAGNOSIS — M2550 Pain in unspecified joint: Secondary | ICD-10-CM

## 2021-02-22 NOTE — Progress Notes (Addendum)
[facility]                                                             [address], [place], [place], [address]                                                                  Phn. [phone]; Fax: [phone]                                                                             Date: 02/22/21  Reason for Referral: Chronic active EBV Infection  Requesting  Provider: [name]  Assessment Problem List Items Addressed This Visit       Other   Arthralgia   Other Visit Diagnoses     Gammaherpesviral mononucleosis with other complications    -  Primary   Relevant Orders   Epstein-Barr virus VCA, IgG (Completed)   Epstein-Barr virus VCA, IgM (Completed)   Epstein-Barr Virus Nuclear Antigen Antibody, IGG (Completed)   Heterophile,Mono Screen(REFL) (Completed)   Epstein-Barr virus early D antigen antibody, IgG (Completed)   Hepatitis C antibody   Hepatitis B surface antigen   Hepatitis B surface antibody,qualitative   Pathologist smear review   Urinalysis, Routine w reflex microscopic (Completed)   US Abdomen Complete   C-reactive protein (Completed)   Sedimentation rate (Completed)   Hepatic function panel (Completed)   CBC with Differential/Platelet (Completed)   Epstein Barr Virus DNA, Quant RTPCR       ? Infectious Mononucleosis vs Viral syndrome  Her labs with positive EBV VCA IgG, equivocal EBV VCA IgM and positive EBV NA IgG is not suggestive of recent infection and past exposure  CMV IgM and IgG are negative HIV NR, RPR NR Urine GC Negative   Generalized arthralgia - ANA, anti CCP antibodies, ds DNA ab, RA negative   Plan Her presentation is not clinically suggestive of chronic active EBV infection. Her symptoms are almost resolved except generalized arthralgia. Will recheck serology for EBV including PCR  US abdomen   UA for suprapubic  tenderness  She will benefit from evaluation with Rheumatology. She has been already referred to Rheumatology by PCP.   Fu in 1 week   All questions and concerns were discussed and addressed. Patient verbalized understanding of the plan. ____________________________________________________________________________________________________________________  HPI: 20 YO female with PMH of ADHD, Anxiety, Depression, who is referred here for evaluation of concern for Infectious Mononucleosis. Patient is accompanied by her mother. History obtained from patient and mother.   She says she had 3 Mononucleosis so far, at the age of 50, 70 and recently in March 2022. In her recent episode in March 2022, she had enlarged tonsils, enlarged lymph nodes,unsure about fevers. She also had generalised arthralgia  including her bilateral shoulder joints, elbow jonts, wrist joint, finger, knee joints, ankle joints etc. She also had severe myalgia and fatigue. Enlarged tonsils and swollen lymph nodes have all resolved but she continued to have generalised arthralgia. She says she did not have arthralgia in the last 2 days however. Her joint pain is severe enough that she is not able to open the door knobs, open water bottle etc.She also feels her joints are stiff at times. Denies any swelling /warmth/erythema or signs of septic joint. Had nausea, few episodes of NBNB vomiting during, occasional sharp chest pain that time. Denied any cough, SOB or GU symptoms however. Denies any other rashes except on and off allergic rashes. Appetite is good, has gained 4 lbs in the last few months . No dysphagia or odynophagia. No headache, numbness or weakness.   She thinks she had COVID in January, was not tested however, had cough and sore throat that improved in 2 days. She says she has been vaccinated for covid including booster.   She was born in [place], [place] and moved to [place] at the age of 1 and has been living in  [place] since then with her parents and siblings. Mother is a Manufacturing systems engineer and father works in stocks. She has a Software engineer, fish in aquarium, 2 dogs at home and cat outside home. No recent travel outside of state except a beach in [place] in December 2021. Has traveled to [place] many years ago. She is studying dance major.   Sexually active with female partner, last was 2 days ago. Menstrual cyclis is irregular as she has implant and takes OCP for break through bleeding. Smokes tobacco occasionally including marijuana. Denies alcohol or IVDU.  ROS: Constitutional: Negative for fever, chills, activity change, appetite change, fatigue HENT: Negative for congestion, sore throat, rhinorrhea, sneezing, trouble swallowing and sinus pressure.  Eyes: Negative for photophobia and visual disturbance.  Respiratory: Negative for cough, chest tightness, shortness of breath, wheezing and stridor.  Cardiovascular: Negative for chest pain, palpitations and leg swelling.  Gastrointestinal: Negative for nausea, vomiting, abdominal pain, diarrhea, constipation, blood in stool, abdominal distention and anal bleeding.  Genitourinary: Negative for dysuria, hematuria, flank pain and difficulty urinating.  Musculoskeletal: Negative for myalgias, back pain, joint swelling, and gait problem.  Skin: Negative for color change, pallor, rash and wound.  Neurological: Negative for dizziness, tremors, weakness and light-headedness.  Hematological: Negative for adenopathy. Does not bruise/bleed easily.  Psychiatric/Behavioral: Negative for behavioral problems, confusion, sleep disturbance, dysphoric mood, decreased concentration and agitation.   Past Medical History:  Diagnosis Date   ADHD (attention deficit hyperactivity disorder)    Anxiety    Phreesia 05/08/2020   Depression    Phreesia 05/08/2020   Ingrown left big toenail 01/2015   Past Surgical History:  Procedure Laterality Date   LABIOPLASTY N/A 03/21/2018    Procedure: LABIAPLASTY;  Surgeon: Glenna Fellows, MD;  Location:  SUR[facility]ERY CENTER;  Service: Plastics;  Laterality: N/A;   STRABISMUS SURGERY Bilateral 04/16/2003   WISDOM TOOTH EXTRACTION     Current Outpatient Medications on File Prior to Visit  Medication Sig Dispense Refill   DULoxetine (CYMBALTA) 20 MG capsule Take 2 capsules (40 mg total) by mouth daily. 180 capsule 3   Methylphenidate HCl ER, XR, (APTENSIO XR) 15 MG CP24 Take 15 mg by mouth daily. 30 capsule 0   minocycline (MINOCIN) 100 MG capsule Take 1 capsule (100 mg total) by mouth 2 (two) times daily. 180 capsule 3   Vitamin  D, Ergocalciferol, (DRISDOL) 1.25 MG (50000 UNIT) CAPS capsule TAKE 1 CAPSULE (50,000 UNITS TOTAL) BY MOUTH EVERY 7 (SEVEN) DAYS (Patient not taking: Reported on 02/02/2021) 4 capsule 1   No current facility-administered medications on file prior to visit.   Allergies  Allergen Reactions   Amoxicillin Rash    AS A CHILD   Social History   Socioeconomic History   Marital status: Single    Spouse name: Not on file   Number of children: Not on file   Years of education: Not on file   Highest education level: Not on file  Occupational History   Not on file  Tobacco Use   Smoking status: Light Tobacco Smoker   Smokeless tobacco: Never Used  Vaping Use   Vaping Use: Former   Substances: Nicotine  Substance and Sexual Activity   Alcohol use: No   Drug use: Yes    Types: Marijuana   Sexual activity: Yes    Birth control/protection: Pill, Implant  Other Topics Concern   Not on file  Social History Narrative   Not on file   Social Determinants of Health   Financial Resource Strain: Not on file  Food Insecurity: Not on file  Transportation Needs: Not on file  Physical Activity: Not on file  Stress: Not on file  Social Connections: Not on file  Intimate Partner Violence: Not on file    Vitals BP 130/90   Pulse 81   Temp 98.7 F (37.1 C)   Wt 104 lb (47.2 kg)   SpO2 100%    BMI 18.90 kg/m    Examination  General - not in acute distress, comfortably sitting in chair HEENT - PEERLA, no pallor and no icterus, no enlarged tonsils/exudate, posterior pharyngeal erythema /exudate Chest - b/l clear air entry, no additional sounds CVS- Normal s1s2, RRR Abdomen - Soft, Non tender , non distended Ext- no pedal edema Neuro: grossly normal Back - WNL Lymph nodes - no lymph nodes palpable ( cervical and inguinal) Psych : calm and cooperative   Recent labs CBC Latest Ref Rng & Units 02/02/2021 01/03/2021 12/06/2020  WBC 3.8 - 10.8 Thousand/uL 8.8 8.3 6.5  Hemoglobin 11.7 - 15.5 g/dL 03.7 04.8 88.9  Hematocrit 35.0 - 45.0 % 40.8 41.0 40.9  Platelets 140 - 400 Thousand/uL 324 355 319   CMP Latest Ref Rng & Units 02/02/2021 12/06/2020 09/01/2018  Glucose 65 - 139 mg/dL 73 84 91  BUN 7 - 25 mg/dL 19 13 15   Creatinine 0.50 - 1.10 mg/dL 1.69 4.50  Sodium 135 - 146 mmol/L 139 140 139  Potassium 3.5 - 5.3 mmol/L 4.3 4.9 4.8  Chloride 98 - 110 mmol/L 103 103 103  CO2 20 - 32 mmol/L 25 25 27   Calcium 8.6 - 10.2 mg/dL 9.6 9.6 9.7  Total Protein 6.1 - 8.1 g/dL - 6.6 6.8  Total Bilirubin 0.2 - 1.2 mg/dL - 0.4 0.3  Alkaline Phos 69 - 325 U/L - - -  AST 10 - 30 U/L - 15 14  ALT 6 - 29 U/L - 10 11     Pertinent Microbiology Results for orders placed or performed in visit on 12/09/19  Novel Coronavirus, NAA ([facility])     Status: None   Collection Time: 12/09/19  3:20 PM   Specimen: Nasopharyngeal(NP) swabs in vial transport medium   NASOPHARYNGE  TESTING  Result Value Ref Range Status   SARS-CoV-2, NAA Not Detected Not Detected Final    Comment: Testing was performed  using the cobas(R) SARS-CoV-2 test. This nucleic acid amplification test was developed and its performance characteristics determined by [facility]. Nucleic acid amplification tests include RT-PCR and TMA. This test has not been FDA cleared or approved. This test has been authorized by FDA  under an Emergency Use Authorization (EUA). This test is only authorized for the duration of time the declaration that circumstances exist justifying the authorization of the emergency use of in vitro diagnostic tests for detection of SARS-CoV-2 virus and/or diagnosis of COVID-19 infection under section 564(b)(1) of the Act, 21 U.S.C. 161WRU-0(A360bbb-3(b) (1), unless the authorization is terminated or revoked sooner. When diagnostic testing is negative, the possibility of a false negative result should be considered in the context of a patient's recent exposures and the presence of clinical signs and symptoms consistent with COVID-19. An individual without sympto ms of COVID-19 and who is not shedding SARS-CoV-2 virus would expect to have a negative (not detected) result in this assay.   SARS-COV-2, NAA 2 DAY TAT     Status: None   Collection Time: 12/09/19  3:20 PM   NASOPHARYNGE  TESTING  Result Value Ref Range Status   SARS-CoV-2, NAA 2 DAY TAT Performed  Final    Pertinent Imaging All pertinent labs/Imagings/notes reviewed. All pertinent plain films and CT images have been personally visualized and interpreted; radiology reports have been reviewed. Decision making incorporated into the Impression / Recommendations.  I have spent 60 minutes for this patient encounter including  review of prior medical records with greater than 50% of time in face to face counsel of the patient/discussing diagnostics and plan of care.   Electronically signed by:  Odette FractionSabina Lazara Grieser, MD Infectious Disease Physician [facility]  [facility] [address] [place], [place] [address] Phone: [phone]  Fax: [phone]

## 2021-02-23 LAB — URINALYSIS, ROUTINE W REFLEX MICROSCOPIC
Bilirubin Urine: NEGATIVE
Glucose, UA: NEGATIVE
Hgb urine dipstick: NEGATIVE
Ketones, ur: NEGATIVE
Leukocytes,Ua: NEGATIVE
Nitrite: NEGATIVE
Protein, ur: NEGATIVE
Specific Gravity, Urine: 1.026 (ref 1.001–1.035)
pH: 6 (ref 5.0–8.0)

## 2021-02-25 LAB — EPSTEIN BARR VIRUS DNA, QUANT RTPCR
EBV DNA, QN PCR: NOT DETECTED Log cps/mL
EBV DNA, QN PCR: NOT DETECTED copies/mL

## 2021-02-25 LAB — HEPATITIS C ANTIBODY
Hepatitis C Ab: NONREACTIVE
SIGNAL TO CUT-OFF: 0.01 (ref <1.00)

## 2021-02-25 LAB — HEPATIC FUNCTION PANEL
AG Ratio: 1.7 (calc) (ref 1.0–2.5)
ALT: 8 U/L (ref 6–29)
AST: 14 U/L (ref 10–30)
Albumin: 4.5 g/dL (ref 3.6–5.1)
Alkaline phosphatase (APISO): 62 U/L (ref 31–125)
Bilirubin, Direct: 0.1 mg/dL (ref 0.0–0.2)
Globulin: 2.6 g/dL (calc) (ref 1.9–3.7)
Indirect Bilirubin: 0.5 mg/dL (calc) (ref 0.2–1.2)
Total Bilirubin: 0.6 mg/dL (ref 0.2–1.2)
Total Protein: 7.1 g/dL (ref 6.1–8.1)

## 2021-02-25 LAB — CBC WITH DIFFERENTIAL/PLATELET
Absolute Monocytes: 487 cells/uL (ref 200–950)
Basophils Absolute: 44 cells/uL (ref 0–200)
Basophils Relative: 0.5 %
Eosinophils Absolute: 87 cells/uL (ref 15–500)
Eosinophils Relative: 1 %
HCT: 40.5 % (ref 35.0–45.0)
Hemoglobin: 13.4 g/dL (ref 11.7–15.5)
Lymphs Abs: 2053 cells/uL (ref 850–3900)
MCH: 29.7 pg (ref 27.0–33.0)
MCHC: 33.1 g/dL (ref 32.0–36.0)
MCV: 89.8 fL (ref 80.0–100.0)
MPV: 9.1 fL (ref 7.5–12.5)
Monocytes Relative: 5.6 %
Neutro Abs: 6029 cells/uL (ref 1500–7800)
Neutrophils Relative %: 69.3 %
Platelets: 346 10*3/uL (ref 140–400)
RBC: 4.51 10*6/uL (ref 3.80–5.10)
RDW: 12.3 % (ref 11.0–15.0)
Total Lymphocyte: 23.6 %
WBC: 8.7 10*3/uL (ref 3.8–10.8)

## 2021-02-25 LAB — EPSTEIN-BARR VIRUS NUCLEAR ANTIGEN ANTIBODY, IGG: EBV NA IgG: 178 U/mL — ABNORMAL HIGH

## 2021-02-25 LAB — HEPATITIS B SURFACE ANTIBODY,QUALITATIVE: Hep B S Ab: NONREACTIVE

## 2021-02-25 LAB — HETEROPHILE,MONO SCREEN(REFL): Heterophile,Mono Screen(REFL): NEGATIVE

## 2021-02-25 LAB — C-REACTIVE PROTEIN: CRP: 5.9 mg/L (ref <8.0)

## 2021-02-25 LAB — PATHOLOGIST SMEAR REVIEW

## 2021-02-25 LAB — EPSTEIN-BARR VIRUS VCA, IGM: EBV VCA IgM: <36 U/mL

## 2021-02-25 LAB — EPSTEIN-BARR VIRUS VCA, IGG: EBV VCA IgG: 441 U/mL — ABNORMAL HIGH

## 2021-02-25 LAB — EPSTEIN-BARR VIRUS EARLY D ANTIGEN ANTIBODY, IGG: EBV EA IgG: <9 U/mL

## 2021-02-25 LAB — SEDIMENTATION RATE: Sed Rate: 11 mm/h (ref 0–20)

## 2021-02-25 LAB — REFLEX TIQ

## 2021-02-25 LAB — HEPATITIS B SURFACE ANTIGEN: Hepatitis B Surface Ag: NONREACTIVE

## 2021-03-01 ENCOUNTER — Ambulatory Visit (INDEPENDENT_AMBULATORY_CARE_PROVIDER_SITE_OTHER): Payer: Medicaid Other | Admitting: Infectious Diseases

## 2021-03-01 ENCOUNTER — Other Ambulatory Visit: Payer: Self-pay | Admitting: Pediatrics

## 2021-03-01 ENCOUNTER — Other Ambulatory Visit: Payer: Self-pay

## 2021-03-01 VITALS — BP 141/99 | HR 76 | Temp 97.9°F | Resp 18 | Ht 61.0 in | Wt 104.8 lb

## 2021-03-01 DIAGNOSIS — B2700 Gammaherpesviral mononucleosis without complication: Secondary | ICD-10-CM

## 2021-03-01 DIAGNOSIS — M2550 Pain in unspecified joint: Secondary | ICD-10-CM

## 2021-03-01 MED ORDER — METHYLPHENIDATE HCL ER (XR) 15 MG PO CP24: 15.0000 mg | ORAL_CAPSULE | Freq: Every day | ORAL | 0 refills | Status: DC

## 2021-03-01 NOTE — Progress Notes (Addendum)
[facility]                                                             [address], [place], [place], [address]                                                                  Phn. [phone]; Fax: [phone]                                                                             Date: 02/28/21  Reason for Follow Up: EBV Infection    Assessment H/o Infectious Mononucleosis Possible Viral syndrome   Her labs with positive EBV VCA IgG, equivocal EBV VCA IgM and positive EBV NA IgG is not suggestive of recent infection and past exposure  CMV IgM and IgG are negative HIV NR, RPR NR Urine GC Negative   Generalized arthralgia - ANA, anti CCP antibodies, ds DNA ab, RA negative. 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   Plan All her symptoms are resolved except arthralgia in her bilateral wrists and rt elbow. She does not have signs of synovitis on exam. Would refer her to Rheumatology for evaluation of her arthralgia.  Fu US abdomen  Will get Parvo virus B 19 IgM and IgG  Fu as needed or if symptoms fail to resolve  All questions and concerns were discussed and addressed. Patient verbalized understanding of the plan. ____________________________________________________________________________________________________________________  HPI: 20 YO female with PMH of ADHD, Anxiety, Depression, who is referred here for evaluation of concern for Infectious Mononucleosis. Patient is accompanied by her mother. History obtained from patient and mother.   She says she had 3 Mononucleosis so far, at the age of 64, 87 and recently in March 2022. In her recent episode in March 2022, she had enlarged tonsils, enlarged lymph nodes,unsure about fevers. She also had generalised arthralgia including her bilateral shoulder joints, elbow jonts, wrist joint,  finger, knee joints, ankle joints etc. She also had severe myalgia and fatigue. Enlarged tonsils and swollen lymph nodes have all resolved but she continued to have generalised arthralgia. She says she did not have arthralgia in the last 2 days however. Her joint pain is severe enough that she is not able to open the door knobs, open water bottle etc.She also feels her joints are stiff at times. Denies any swelling /warmth/erythema or signs of septic joint. Had nausea, few episodes of NBNB vomiting during, occasional sharp chest pain that time. Denied any cough, SOB or GU symptoms however. Denies any other rashes except on and off allergic rashes. Appetite is good, has gained 4 lbs in the last few months . No dysphagia or odynophagia. No  headache, numbness or weakness.   She thinks she had COVID in January, was not tested however, had cough and sore throat that improved in 2 days. She says she has been vaccinated for covid including booster.   [place] was [place] and moved to [place] at the age of 1 and has been living in [place] since then with her parents and siblings. Mother is a Manufacturing systems engineerpreschool teacher and father works in stocks. She has a Software engineerhampster, fish in aquarium, 2 dogs at home and cat outside home. No recent travel outside of state except a beach in [place] in December 2021. Has traveled to [place] many years ago. She is studying dance major.   Sexually active with female partner, last was 2 days ago. Menstrual cyclis is irregular as she has implant and takes OCP for break through bleeding. Smokes tobacco occasionally including marijuana. Denies alcohol or IVDU.  03/01/21 She is here for discussion of lab results.  She is accompanied by her mom.  She states the only thing that is bothering her today is pain at her right wrist including her right elbow and minimal pain at the left wrist.  She denies being having any other joints in her body.  She states the pain is intermittent and it  comes and goes.  She does not have any fever chills sweats. Denies having any nausea vomiting and diarrhea.  Her appetite and weight have been stable.  She denies any rashes.  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  I discussed with her regarding her previous lab results which was unremarkable except possible exposure to EBV infection in the past.  Her ultrasound abdomen is still pending.  I discussed with her that a rheumatology consultation would be beneficial to her. Her PCP has already made a referral to Rheumatology.   ROS: Constitutional: Negative for fever, chills, activity change, appetite change, fatigue HENT: Negative for congestion, sore throat, rhinorrhea, sneezing, trouble swallowing and sinus pressure.  Eyes: Negative for photophobia and visual disturbance.  Respiratory: Negative for cough, chest tightness, shortness of breath, wheezing and stridor.  Cardiovascular: Negative for chest pain, palpitations and leg swelling.  Gastrointestinal: Negative for nausea, vomiting, abdominal pain, diarrhea, constipation, blood in stool, abdominal distention and anal bleeding.  Genitourinary: Negative for dysuria, hematuria, flank pain and difficulty urinating.  Musculoskeletal: Negative for myalgias, back pain, joint swelling, and gait problem.  Skin: Negative for color change, pallor, rash and wound.  Neurological: Negative for dizziness, tremors, weakness and light-headedness.  Hematological: Negative for adenopathy. Does not bruise/bleed easily.  Psychiatric/Behavioral: Negative for behavioral problems, confusion, sleep disturbance, dysphoric mood, decreased concentration and agitation.   Past Medical History:  Diagnosis Date   ADHD (attention deficit hyperactivity disorder)    Anxiety    Phreesia 05/08/2020   Depression    Phreesia 05/08/2020   Ingrown left big toenail 01/2015   Past Surgical History:  Procedure Laterality Date   LABIOPLASTY N/A  03/21/2018   Procedure: LABIAPLASTY;  Surgeon: Glenna Fellowshimmappa, Brinda, MD;  Location: [facility];  Service: Plastics;  Laterality: N/A;   STRABISMUS SURGERY Bilateral 04/16/2003   WISDOM TOOTH EXTRACTION     Current Outpatient Medications on File Prior to Visit  Medication Sig Dispense Refill   DULoxetine (CYMBALTA) 20 MG capsule Take 2 capsules (40 mg total) by mouth daily. 180 capsule 3   Methylphenidate HCl ER, XR, (APTENSIO XR) 15 MG CP24 Take 15 mg by mouth daily. 30  capsule 0   minocycline (MINOCIN) 100 MG capsule Take 1 capsule (100 mg total) by mouth 2 (two) times daily. (Patient not taking: Reported on 02/22/2021) 180 capsule 3   Vitamin D, Ergocalciferol, (DRISDOL) 1.25 MG (50000 UNIT) CAPS capsule TAKE 1 CAPSULE (50,000 UNITS TOTAL) BY MOUTH EVERY 7 (SEVEN) DAYS (Patient not taking: No sig reported) 4 capsule 1   No current facility-administered medications on file prior to visit.   Allergies  Allergen Reactions   Amoxicillin Rash    AS A CHILD   Social History   Socioeconomic History   Marital status: Single    Spouse name: Not on file   Number of children: Not on file   Years of education: Not on file   Highest education level: Not on file  Occupational History   Not on file  Tobacco Use   Smoking status: Light Smoker    Pack years: 0.00   Smokeless tobacco: Never  Vaping Use   Vaping Use: Former   Substances: Nicotine  Substance and Sexual Activity   Alcohol use: No   Drug use: Yes    Types: Marijuana   Sexual activity: Yes    Birth control/protection: Pill, Implant  Other Topics Concern   Not on file  Social History Narrative   Not on file   Social Determinants of Health   Financial Resource Strain: Not on file  Food Insecurity: Not on file  Transportation Needs: Not on file  Physical Activity: Not on file  Stress: Not on file  Social Connections: Not on file  Intimate Partner Violence: Not on file    Vitals BP (!) 141/99 (BP Location:  Left Arm, Patient Position: Sitting, Cuff Size: Normal)   Pulse 76   Temp 97.9 F (36.6 C) (Oral)   Resp 18   Ht 5\' 1"  (1.549 m)   Wt 104 lb 12.8 oz (47.5 kg)   SpO2 100%   BMI 19.80 kg/m     Examination  General - not in acute distress, comfortably sitting in chair HEENT - PEERLA, no pallor and no icterus, no enlarged tonsils/exudate, posterior pharyngeal erythema /exudate Chest - b/l clear air entry, no additional sounds CVS- Normal s1s2, RRR Abdomen - Soft, Non tender , non distended Ext- no pedal edema, NO SIGNS OF SYNOVITIS ON PERIPHERAL JOINTS Neuro: grossly normal Back - WNL Skin- no rashes  Lymph nodes - no lymph nodes palpable ( cervical and inguinal) Psych : calm and cooperative   Recent labs CBC Latest Ref Rng & Units 02/22/2021 02/02/2021 01/03/2021  WBC 3.8 - 10.8 Thousand/uL 8.7 8.8 8.3  Hemoglobin 11.7 - 15.5 g/dL 01/05/2021 37.8 58.8  Hematocrit 35.0 - 45.0 % 40.5 40.8 41.0  Platelets 140 - 400 Thousand/uL 346 324 355   CMP Latest Ref Rng & Units 02/22/2021 02/02/2021 12/06/2020  Glucose 65 - 139 mg/dL - 73 84  BUN 7 - 25 mg/dL - 19 13  Creatinine 12/08/2020 - 1.10 mg/dL - 7.74 1.28  Sodium 7.86 - 146 mmol/L - 139 140  Potassium 3.5 - 5.3 mmol/L - 4.3 4.9  Chloride 98 - 110 mmol/L - 103 103  CO2 20 - 32 mmol/L - 25 25  Calcium 8.6 - 10.2 mg/dL - 9.6 9.6  Total Protein 6.1 - 8.1 g/dL 7.1 - 6.6  Total Bilirubin 0.2 - 1.2 mg/dL 0.6 - 0.4  Alkaline Phos 69 - 325 U/L - - -  AST 10 - 30 U/L 14 - 15  ALT 6 - 29 U/L  8 - 10     Pertinent Microbiology Results for orders placed or performed in visit on 12/09/19  Novel Coronavirus, NAA ([facility])     Status: None   Collection Time: 12/09/19  3:20 PM   Specimen: Nasopharyngeal(NP) swabs in vial transport medium   NASOPHARYNGE  TESTING  Result Value Ref Range Status   SARS-CoV-2, NAA Not Detected Not Detected Final    Comment: Testing was performed using the cobas(R) SARS-CoV-2 test. This nucleic acid amplification test was  developed and its performance characteristics determined by [employer]. Nucleic acid amplification tests include RT-PCR and TMA. This test has not been FDA cleared or approved. This test has been authorized by FDA under an Emergency Use Authorization (EUA). This test is only authorized for the duration of time the declaration that circumstances exist justifying the authorization of the emergency use of in vitro diagnostic tests for detection of SARS-CoV-2 virus and/or diagnosis of COVID-19 infection under section 564(b)(1) of the Act, 21 U.S.C. 371GGY-6(R) (1), unless the authorization is terminated or revoked sooner. When diagnostic testing is negative, the possibility of a false negative result should be considered in the context of a patient's recent exposures and the presence of clinical signs and symptoms consistent with COVID-19. An individual without sympto ms of COVID-19 and who is not shedding SARS-CoV-2 virus would expect to have a negative (not detected) result in this assay.   SARS-COV-2, NAA 2 DAY TAT     Status: None   Collection Time: 12/09/19  3:20 PM   NASOPHARYNGE  TESTING  Result Value Ref Range Status   SARS-CoV-2, NAA 2 DAY TAT Performed  Final    Pertinent Imaging All pertinent labs/Imagings/notes reviewed. All pertinent plain films and CT images have been personally visualized and interpreted; radiology reports have been reviewed. Decision making incorporated into the Impression / Recommendations.  I have spent 60 minutes for this patient encounter including  review of prior medical records with greater than 50% of time in face to face counsel of the patient/discussing diagnostics and plan of care.   Electronically signed by:  Odette Fraction, MD Infectious Disease Physician [facility] [address] [place], [place] [address] Phone: [phone]  Fax: [phone]

## 2021-03-02 ENCOUNTER — Telehealth: Payer: Self-pay

## 2021-03-02 NOTE — Telephone Encounter (Signed)
-----   Message from Odette Fraction, MD sent at 03/02/2021  7:30 AM EDT ----- Regarding: Lab visit Patient needs to come for lab visit. I have talked with her and she is aware about this.

## 2021-03-02 NOTE — Telephone Encounter (Signed)
Called patient to schedule lab appointment for parvovirus labs. Patient does not wish to complete those labs at this time and states she will just go to rheumatology.   Sandie Ano, RN

## 2021-03-02 NOTE — Telephone Encounter (Signed)
Ok, no problem!

## 2021-03-02 NOTE — Addendum Note (Signed)
Addended by: Odette Fraction on: 03/02/2021 08:12 AM   Modules accepted: Orders

## 2021-03-09 ENCOUNTER — Other Ambulatory Visit: Payer: Self-pay

## 2021-03-09 ENCOUNTER — Encounter (HOSPITAL_COMMUNITY): Payer: Self-pay

## 2021-03-09 ENCOUNTER — Ambulatory Visit (INDEPENDENT_AMBULATORY_CARE_PROVIDER_SITE_OTHER): Payer: Medicaid Other

## 2021-03-09 ENCOUNTER — Ambulatory Visit (HOSPITAL_COMMUNITY)
Admission: EM | Admit: 2021-03-09 | Discharge: 2021-03-09 | Disposition: A | Discharge status: Home / Self Care | Payer: Medicaid Other | Attending: Family Medicine | Admitting: Family Medicine

## 2021-03-09 DIAGNOSIS — M25531 Pain in right wrist: Secondary | ICD-10-CM

## 2021-03-09 DIAGNOSIS — R030 Elevated blood-pressure reading, without diagnosis of hypertension: Secondary | ICD-10-CM

## 2021-03-09 DIAGNOSIS — S63501A Unspecified sprain of right wrist, initial encounter: Secondary | ICD-10-CM | POA: Diagnosis not present

## 2021-03-09 NOTE — ED Triage Notes (Signed)
Pt presents with recurrent wrist pain & weakness for past few weeks non injury related.

## 2021-03-09 NOTE — Discharge Instructions (Addendum)
Xray today negative for any fracture or dislocation  May take 800 mg ibuprofen with 1000 mg of Tylenol.  Do not exceed 4000 mg of Tylenol in 24 hours.  We have placed a brace to your wrist today. Wear this when working, and when sleeping  May apply ice to the area when swelling  Follow up with sports medicine if symptoms are persisting  Keep a log of your blood pressures and follow up with PCP

## 2021-03-09 NOTE — ED Provider Notes (Signed)
[facility]   [id number] 03/09/21 Arrival Time: 1557  PY:[id number] PAIN  SUBJECTIVE: History from: patient. Gail Reed is a 20 y.o. female complains of right wrist pain that began about a week ago. Reports that she also fell and caught herself on the wrist. Describes the pain as constant and achy in character with intermittent sharp pain. Has tried OTC medications without relief. Symptoms are made worse with activity.  Denies similar symptoms in the past.  Denies fever, chills, erythema, ecchymosis, effusion, weakness, numbness and tingling, saddle paresthesias, loss of bowel or bladder function.      ROS: As per HPI.  All other pertinent ROS negative.     Past Medical History:  Diagnosis Date   ADHD (attention deficit hyperactivity disorder)    Anxiety    Phreesia 05/08/2020   Depression    Phreesia 05/08/2020   Ingrown left big toenail 01/2015   Past Surgical History:  Procedure Laterality Date   LABIOPLASTY N/A 03/21/2018   Procedure: LABIAPLASTY;  Surgeon: Glenna Fellows, MD;  Location: [facility];  Service: Plastics;  Laterality: N/A;   STRABISMUS SURGERY Bilateral 04/16/2003   WISDOM TOOTH EXTRACTION     Allergies  Allergen Reactions   Amoxicillin Rash    AS A CHILD   No current facility-administered medications on file prior to encounter.   Current Outpatient Medications on File Prior to Encounter  Medication Sig Dispense Refill   DULoxetine (CYMBALTA) 20 MG capsule Take 2 capsules (40 mg total) by mouth daily. 180 capsule 3   Methylphenidate HCl ER, XR, (APTENSIO XR) 15 MG CP24 Take 15 mg by mouth daily. 30 capsule 0   minocycline (MINOCIN) 100 MG capsule Take 1 capsule (100 mg total) by mouth 2 (two) times daily. 180 capsule 3   Vitamin D, Ergocalciferol, (DRISDOL) 1.25 MG (50000 UNIT) CAPS capsule TAKE 1 CAPSULE (50,000 UNITS TOTAL) BY MOUTH EVERY 7 (SEVEN) DAYS 4 capsule 1   Social History   Socioeconomic History   Marital status:  Single    Spouse name: Not on file   Number of children: Not on file   Years of education: Not on file   Highest education level: Not on file  Occupational History   Not on file  Tobacco Use   Smoking status: Light Smoker    Pack years: 0.00   Smokeless tobacco: Never  Vaping Use   Vaping Use: Former   Substances: Nicotine  Substance and Sexual Activity   Alcohol use: No   Drug use: Yes    Types: Marijuana   Sexual activity: Yes    Birth control/protection: Pill, Implant  Other Topics Concern   Not on file  Social History Narrative   Not on file   Social Determinants of Health   Financial Resource Strain: Not on file  Food Insecurity: Not on file  Transportation Needs: Not on file  Physical Activity: Not on file  Stress: Not on file  Social Connections: Not on file  Intimate Partner Violence: Not on file   Family History  Problem Relation Age of Onset   Bipolar disorder Brother    Eating disorder Maternal Aunt    Hypertension Mother    Hypertension Father    Hyperlipidemia Father    Hyperlipidemia Paternal Aunt    Hyperlipidemia Maternal Grandmother     OBJECTIVE:  Vitals:   03/09/21 1636  BP: (!) 158/103  Pulse: 96  Resp: 18  Temp: 98.7 F (37.1 C)  TempSrc: Oral  SpO2:  100%    General appearance: ALERT; in no acute distress.  Head: NCAT Lungs: Normal respiratory effort CV: pulses 2+ bilaterally. Cap refill < 2 seconds Musculoskeletal:  Inspection: Skin warm, dry, clear and intact No erythema noted Mild swelling to dorsal surface of the R hand and wrist Palpation: R wrist mildly tender to palpation ROM: Limited ROM active and passive to R wrist Skin: warm and dry Neurologic: Ambulates without difficulty; Sensation intact about the upper/ lower extremities Psychological: alert and cooperative; normal mood and affect  DIAGNOSTIC STUDIES:  DG Wrist Complete Right  Result Date: 03/09/2021 CLINICAL DATA:  20 year old female with fall and  swelling of the wrist. EXAM: RIGHT WRIST - COMPLETE 3+ VIEW COMPARISON:  None. FINDINGS: There is no acute fracture or dislocation. The bones are well mineralized. No arthritic changes. Mild soft tissue swelling of the wrist. No radiopaque foreign object or soft tissue gas. IMPRESSION: Negative. Electronically Signed   By: Elgie Collard M.D.   On: 03/09/2021 17:16     ASSESSMENT & PLAN:  1. Sprain of right wrist, initial encounter   2. Right wrist pain   3. Elevated blood pressure reading in office without diagnosis of hypertension    Xray negative Wrist brace applied in office today Wear when working, active and sleeping Continue conservative management of rest, ice, and gentle stretches Take ibuprofen as needed for pain relief (may cause abdominal discomfort, ulcers, and GI bleeds avoid taking with other NSAIDs)  Follow up with sports medicine Return or go to the ER if you have any new or worsening symptoms (fever, chills, chest pain, abdominal pain, changes in bowel or bladder habits, pain radiating into lower legs)   [place] Controlled Substances Registry consulted for this patient. I feel the risk/benefit ratio today is favorable for proceeding with this prescription for a controlled substance. Medication sedation precautions given.  Reviewed expectations re: course of current medical issues. Questions answered. Outlined signs and symptoms indicating need for more acute intervention. Patient verbalized understanding. After Visit Summary given.        Moshe Cipro, NP 03/10/21 1555

## 2021-03-10 ENCOUNTER — Other Ambulatory Visit: Payer: Self-pay

## 2021-03-10 DIAGNOSIS — F902 Attention-deficit hyperactivity disorder, combined type: Secondary | ICD-10-CM

## 2021-03-13 ENCOUNTER — Other Ambulatory Visit: Payer: Self-pay | Admitting: Pediatrics

## 2021-03-13 MED ORDER — METHYLPHENIDATE HCL ER (XR) 15 MG PO CP24: 15.0000 mg | ORAL_CAPSULE | Freq: Every day | ORAL | 0 refills | Status: DC

## 2021-03-13 MED ORDER — DULOXETINE HCL 20 MG PO CPEP: 20.0000 mg | ORAL_CAPSULE | Freq: Every day | ORAL | 1 refills | Status: DC

## 2021-03-22 ENCOUNTER — Telehealth: Payer: Medicaid Other | Admitting: Pediatrics

## 2021-04-04 NOTE — Progress Notes (Signed)
Office Visit Note  Patient: Gail Reed             Date of Birth: Mar 28, 2001           MRN: [id number]             PCP: Trude Mcburney, FNP Referring: Trude Mcburney, FNP Visit Date: 04/05/2021 Occupation: Dance student  Subjective:  New Patient (Initial Visit) (Patient complains of bilateral wrist/hand and right elbow pain and stiffness. Patient was diagnosed with right wrist sprain recently and is currently wearing a wrist brace. )   History of Present Illness: Gail Reed is a 20 y.o. female with a history of recurrent infectious mononucleosis, depression and anxiety here for evaluation of chronic joint pain in multiple sites.  She has had chronic joint pains previously but current problems are really ongoing since sometime last year.  These became increased since earlier this year especially pain in her wrists and elbows.  There is swelling and pain with decreased range of motion in the right wrist she does not recall exactly when this started.  She was seen at the emergency department a month ago at that time pain and swelling were suspected to be soft tissue injury from a fall that she had about 2 weeks before the visit.  She was given a right wrist brace symptoms have remained persistent for a month since that time.  She continues to have pain worst in the right greater than left wrists bilaterally also of the right elbow.  She takes Cymbalta for mood symptoms without recent modification no other anti-inflammatory or analgesic medications.  She has taken minocycline for acne. Besides joint pains she does describe chronic fatigue difficulty sleeping well.  She has intermittent lymphadenopathy most commonly in cervical distribution.  She denies symptoms of photosensitive rashes, pleurisy, has no history of blood clots and has never been pregnant.   Labs reviewed 02/2021 LFTs wnl CRP 5.9 HBV neg HCV neg  01/2021 CBC wnl BMP wnl Uric acid 4.6 UA  +protein  11/2020 Ferritin 11 Vit D 19 dsDNA neg RF neg CCP neg ANA neg ESR 2 CRP 0.6 NG/CT neg  Imaging reviewed 03/09/21 Xray right wrist FINDINGS: There is no acute fracture or dislocation. The bones are well mineralized. No arthritic changes. Mild soft tissue swelling of the wrist. No radiopaque foreign object or soft tissue gas. IMPRESSION: Negative.  Activities of Daily Living:  Patient reports morning stiffness for 0 minutes.   Patient Denies nocturnal pain.  Difficulty dressing/grooming: Reports Difficulty climbing stairs: Denies Difficulty getting out of chair: Reports Difficulty using hands for taps, buttons, cutlery, and/or writing: Reports  Review of Systems  Constitutional:  Positive for fatigue.  HENT:  Negative for mouth sores, mouth dryness and nose dryness.   Eyes:  Negative for pain, itching, visual disturbance and dryness.  Respiratory:  Negative for cough, hemoptysis, shortness of breath and difficulty breathing.   Cardiovascular:  Positive for chest pain and palpitations. Negative for swelling in legs/feet.  Gastrointestinal:  Positive for abdominal pain and constipation. Negative for blood in stool and diarrhea.  Endocrine: Negative for increased urination.  Genitourinary:  Negative for painful urination.  Musculoskeletal:  Positive for joint pain, joint pain, joint swelling and muscle tenderness. Negative for myalgias, muscle weakness, morning stiffness and myalgias.  Skin:  Positive for color change. Negative for rash and redness.  Allergic/Immunologic: Negative for susceptible to infections.  Neurological:  Positive for dizziness, light-headedness and weakness. Negative for numbness,  headaches and memory loss.  Hematological:  Positive for swollen glands.  Psychiatric/Behavioral:  Positive for sleep disturbance. Negative for confusion.    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   Nexplanon in place 11/18/2017   Attention deficit hyperactivity disorder (ADHD), 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   Adjustment disorder 10/22/2017    Past Medical History:  Diagnosis Date   ADHD (attention deficit hyperactivity disorder)    Anxiety    Phreesia 05/08/2020   Depression    Phreesia 05/08/2020   Hypertension    Per patient   Ingrown left big toenail 01/16/2015    Family History  Problem Relation Age of Onset   Hypertension Mother    Hypertension Father    Hyperlipidemia Father    Bipolar disorder Brother    ADD / ADHD Brother    ADD / ADHD Brother    Eating disorder Maternal Aunt    Hyperlipidemia Paternal Aunt    Hyperlipidemia Maternal Grandmother    Thyroid disease Maternal Grandmother    Thyroid disease Maternal Great-grandmother    Rheum arthritis Maternal Great-grandmother    Past Surgical History:  Procedure Laterality Date   LABIOPLASTY N/A 03/21/2018   Procedure: LABIAPLASTY;  Surgeon: Irene Limbo, MD;  Location: [place];  Service: Plastics;  Laterality: N/A;   STRABISMUS SURGERY Bilateral 04/16/2003   WISDOM TOOTH EXTRACTION     Social History   Social History Narrative   Not on file   Immunization History  Administered Date(s) Administered   PFIZER(Purple Top)SARS-COV-2 Vaccination 09/02/2020     Objective: Vital Signs: BP 100/68 (BP Location: Right Arm, Patient Position: Sitting, Cuff Size: Normal)   Pulse (!) 118   Ht 5' 2" (1.575 m)   Wt 107 lb (48.5 kg)   BMI 19.57 kg/m    Physical Exam HENT:     Right Ear: External  ear normal.     Left Ear: External ear normal.     Mouth/Throat:     Mouth: Mucous membranes are moist.     Pharynx: Oropharynx is clear.  Eyes:     Conjunctiva/sclera: Conjunctivae normal.  Cardiovascular:     Rate and Rhythm: Regular rhythm. Tachycardia present.  Pulmonary:     Effort: Pulmonary effort is normal.     Breath sounds: Normal breath sounds.  Skin:    General: Skin is warm and dry.     Findings: No rash.  Neurological:     General: No focal deficit present.     Mental Status: She is alert.     Deep Tendon Reflexes: Reflexes normal.  Psychiatric:        Mood and Affect: Mood normal.     Musculoskeletal Exam:  Neck full ROM no tenderness Shoulders full ROM no tenderness or swelling Elbows full ROM right elbow mild tenderness without palpable swelling Right wrist significantly swollen mildly warm to touch tender to palpation decreased extension range of motion, left wrist tenderness without palpable swelling, limited ultrasound inspection showing synovitis with color Doppler enhancement in the right wrist  Fingers full ROM no tenderness or swelling Knees full ROM no tenderness or swelling Ankles full ROM no tenderness or swelling   CDAI Exam: CDAI Score: -- Patient Global: --; Provider Global: -- Swollen: 1 ; Tender: 3  Joint Exam 04/05/2021      Right  Left  Elbow   Tender     Wrist  Swollen Tender   Tender     Investigation: No additional findings.  Imaging: No results found.  Recent Labs: Lab Results  Component Value Date   WBC 8.7 02/22/2021   HGB 13.4 02/22/2021   PLT 346 02/22/2021   NA 139 02/02/2021   K 4.3 02/02/2021   CL 103 02/02/2021   CO2 25 02/02/2021   GLUCOSE 73 02/02/2021   BUN 19 02/02/2021   CREATININE 0.73 02/02/2021   BILITOT 0.6 02/22/2021   ALKPHOS 123 04/04/2009   AST 14 02/22/2021   ALT 8 02/22/2021   PROT 7.1 02/22/2021   ALBUMIN 3.0 (L) 04/04/2009   CALCIUM 9.6 02/02/2021   GFRAA  04/04/2009    NOT CALCULATED         The eGFR has been calculated using the MDRD equation. This calculation has not been validated in all clinical situations. eGFR's persistently <60 mL/min signify possible Chronic Kidney Disease.    Speciality Comments: No specialty comments available.  Procedures:  No procedures performed Allergies: Amoxicillin   Assessment / Plan:     Visit Diagnoses: Swelling of both wrists  Pain in right elbow  Arthralgia, unspecified joint - Plan: Sedimentation rate, C-reactive protein, IgG, IgA, IgM, predniSONE (DELTASONE) 10 MG tablet  Focal distribution of joint pains with obvious swelling on exam looks indicative for an inflammatory arthritis.  The questionable history of recent trauma to the right wrist does not seem typical to explain her symptoms with no improvement after 6 weeks unless injury more complicated than initial assessment such as SLACC wrist.  Also symptoms on left side although milder suggest systemic process although seronegative disease.  Checking inflammatory markers sed rate and CRP and immunoglobulins.  Recommend trial of oral prednisone and reassessment.  Lymphadenopathy - Plan: IgG, IgA, IgM  Cervical lymphadenopathy subcentimeter and nontender on exam today.  She does report this as a chronic problem questionable relation to previous viral infections residual enlargement versus reactive inflammatory process now.  No current indication for biopsy.  Orders: Orders Placed This Encounter  Procedures   Sedimentation rate   C-reactive protein   IgG, IgA, IgM   Meds ordered this encounter  Medications   predniSONE (DELTASONE) 10 MG tablet    Sig: Take 2 tablets (20 mg total) by mouth daily with breakfast for 5 days, THEN 1 tablet (10 mg total) daily with breakfast for 5 days.    Dispense:  15 tablet    Refill:  0     Follow-Up Instructions: Return in about 19 days (around 04/24/2021) for New pt f/u ?arthritis GC trial 2wks.   Collier Salina, MD  Note -  This record has been created using [employer].  Chart creation errors have been sought, but may not always  have been located. Such creation errors do not reflect on  the standard of medical care.

## 2021-04-05 ENCOUNTER — Ambulatory Visit: Payer: Medicaid Other | Admitting: Internal Medicine

## 2021-04-05 ENCOUNTER — Encounter: Payer: Self-pay | Admitting: Internal Medicine

## 2021-04-05 ENCOUNTER — Other Ambulatory Visit: Payer: Self-pay

## 2021-04-05 ENCOUNTER — Telehealth: Payer: Medicaid Other | Admitting: Pediatrics

## 2021-04-05 VITALS — BP 100/68 | HR 118 | Ht 62.0 in | Wt 107.0 lb

## 2021-04-05 DIAGNOSIS — M25432 Effusion, left wrist: Secondary | ICD-10-CM

## 2021-04-05 DIAGNOSIS — M25431 Effusion, right wrist: Secondary | ICD-10-CM

## 2021-04-05 DIAGNOSIS — M25439 Effusion, unspecified wrist: Secondary | ICD-10-CM | POA: Insufficient documentation

## 2021-04-05 DIAGNOSIS — R591 Generalized enlarged lymph nodes: Secondary | ICD-10-CM | POA: Diagnosis not present

## 2021-04-05 DIAGNOSIS — M25521 Pain in right elbow: Secondary | ICD-10-CM | POA: Diagnosis not present

## 2021-04-05 DIAGNOSIS — M2550 Pain in unspecified joint: Secondary | ICD-10-CM | POA: Diagnosis not present

## 2021-04-05 DIAGNOSIS — F902 Attention-deficit hyperactivity disorder, combined type: Secondary | ICD-10-CM

## 2021-04-05 MED ORDER — PREDNISONE 10 MG PO TABS: ORAL_TABLET | ORAL | 0 refills | Status: AC

## 2021-04-05 NOTE — Patient Instructions (Signed)
Prednisone tablets What is this medication? PREDNISONE (PRED ni sone) is a corticosteroid. It is commonly used to treat inflammation of the skin, joints, lungs, and other organs. Common conditions treated include asthma, allergies, and arthritis. It is also used for otherconditions, such as blood disorders and diseases of the adrenal glands. This medicine may be used for other purposes; ask your health care provider orpharmacist if you have questions. COMMON BRAND NAME(S): Deltasone, Predone, Sterapred, Sterapred DS What should I tell my care team before I take this medication? They need to know if you have any of these conditions: Cushing's syndrome diabetes glaucoma heart disease high blood pressure infection (especially a virus infection such as chickenpox, cold sores, or herpes) kidney disease liver disease mental illness myasthenia gravis osteoporosis seizures stomach or intestine problems thyroid disease an unusual or allergic reaction to lactose, prednisone, other medicines, foods, dyes, or preservatives pregnant or trying to get pregnant breast-feeding How should I use this medication? Take this medicine by mouth with a glass of water. Follow the directions on the prescription label. Take this medicine with food. If you are taking this medicine once a day, take it in the morning. Do not take more medicine than you are told to take. Do not suddenly stop taking your medicine because you may develop a severe reaction. Your doctor will tell you how much medicine to take. If your doctor wants you to stop the medicine, the dose may be slowly loweredover time to avoid any side effects. Talk to your pediatrician regarding the use of this medicine in children.Special care may be needed. Overdosage: If you think you have taken too much of this medicine contact apoison control center or emergency room at once. NOTE: This medicine is only for you. Do not share this medicine with others. What  if I miss a dose? If you miss a dose, take it as soon as you can. If it is almost time for your next dose, talk to your doctor or health care professional. You may need to miss a dose or take an extra dose. Do not take double or extra doses withoutadvice. What may interact with this medication? Do not take this medicine with any of the following medications: metyrapone mifepristone This medicine may also interact with the following medications: aminoglutethimide amphotericin B aspirin and aspirin-like medicines barbiturates certain medicines for diabetes, like glipizide or glyburide cholestyramine cholinesterase inhibitors cyclosporine digoxin diuretics ephedrine female hormones, like estrogens and birth control pills isoniazid ketoconazole NSAIDS, medicines for pain and inflammation, like ibuprofen or naproxen phenytoin rifampin toxoids vaccines warfarin This list may not describe all possible interactions. Give your health care provider a list of all the medicines, herbs, non-prescription drugs, or dietary supplements you use. Also tell them if you smoke, drink alcohol, or use illegaldrugs. Some items may interact with your medicine. What should I watch for while using this medication? Visit your doctor or health care professional for regular checks on your progress. If you are taking this medicine over a prolonged period, carry an identification card with your name and address, the type and dose of yourmedicine, and your doctor's name and address. This medicine may increase your risk of getting an infection. Tell your doctor or health care professional if you are around anyone with measles orchickenpox, or if you develop sores or blisters that do not heal properly. If you are going to have surgery, tell your doctor or health care professionalthat you have taken this medicine within the last twelve months. Ask  your doctor or health care professional about your diet. You may need  tolower the amount of salt you eat. This medicine may increase blood sugar. Ask your healthcare provider if changesin diet or medicines are needed if you have diabetes. What side effects may I notice from receiving this medication? Side effects that you should report to your doctor or health care professionalas soon as possible: allergic reactions like skin rash, itching or hives, swelling of the face, lips, or tongue changes in emotions or moods changes in vision depressed mood eye pain fever or chills, cough, sore throat, pain or difficulty passing urine signs and symptoms of high blood sugar such as being more thirsty or hungry or having to urinate more than normal. You may also feel very tired or have blurry vision. swelling of ankles, feet Side effects that usually do not require medical attention (report to yourdoctor or health care professional if they continue or are bothersome): confusion, excitement, restlessness headache nausea, vomiting skin problems, acne, thin and shiny skin trouble sleeping weight gain This list may not describe all possible side effects. Call your doctor for medical advice about side effects. You may report side effects to FDA at1-800-FDA-1088. Where should I keep my medication? Keep out of the reach of children. Store at room temperature between 15 and 30 degrees C (59 and 86 degrees F). Protect from light. Keep container tightly closed. Throw away any unusedmedicine after the expiration date. NOTE: This sheet is a summary. It may not cover all possible information. If you have questions about this medicine, talk to your doctor, pharmacist, orhealth care provider.  2022 [employer] Standard (2018-06-03 10:54:22)

## 2021-04-06 ENCOUNTER — Other Ambulatory Visit: Payer: Self-pay

## 2021-04-06 ENCOUNTER — Other Ambulatory Visit: Payer: Self-pay | Admitting: Pediatrics

## 2021-04-06 DIAGNOSIS — F4323 Adjustment disorder with mixed anxiety and depressed mood: Secondary | ICD-10-CM

## 2021-04-06 DIAGNOSIS — F902 Attention-deficit hyperactivity disorder, combined type: Secondary | ICD-10-CM

## 2021-04-06 LAB — SEDIMENTATION RATE: Sed Rate: 38 mm/h — ABNORMAL HIGH (ref 0–20)

## 2021-04-06 LAB — C-REACTIVE PROTEIN: CRP: 40 mg/L — ABNORMAL HIGH (ref <8.0)

## 2021-04-06 LAB — IGG, IGA, IGM
IgG (Immunoglobin G), Serum: 1173 mg/dL (ref 600–1640)
IgM, Serum: 85 mg/dL (ref 50–300)
Immunoglobulin A: 390 mg/dL — ABNORMAL HIGH (ref 47–310)

## 2021-04-06 MED ORDER — SERTRALINE HCL 50 MG PO TABS: ORAL_TABLET | ORAL | 2 refills | Status: DC

## 2021-04-06 MED ORDER — METHYLPHENIDATE HCL ER (XR) 15 MG PO CP24: 15.0000 mg | ORAL_CAPSULE | Freq: Every day | ORAL | 0 refills | Status: DC

## 2021-04-07 NOTE — Progress Notes (Signed)
Lab tests show significantly elevated markers for inflammation. These are too high and too long afterwards for simple soft tissue injury such as a sprain so I definitely suspect an ongoing process despite the negative antibody tests. We can follow up as planned to see how symptoms are going and with the trial of prednisone.

## 2021-04-23 NOTE — Progress Notes (Signed)
Office Visit Note  Patient: Gail Reed             Date of Birth: 26-Jul-2001           MRN: [id number]             PCP: Trude Mcburney, FNP Referring: Trude Mcburney, FNP Visit Date: 04/24/2021   Subjective:  Follow-up (Patient recently finished course of Prednisone and does feel as if it relieved her symptoms. )   History of Present Illness: Gail Reed is a 20 y.o. female here for follow up for chronic joint pain with right wrist swelling seen at our last visit with markedly high ESR and CRP. Suspicious for seronegative inflammatory arthritis and recommended trial of oral prednisone for about 2 weeks.  She feels the prednisone improved her joint pain and stiffness but she continues to have a comparable amount of swelling as before.  Otherwise no significant change in symptoms.  Previous HPI: 04/05/21 Gail Reed is a 20 y.o. female with a history of recurrent infectious mononucleosis, depression and anxiety here for evaluation of chronic joint pain in multiple sites.  She has had chronic joint pains previously but current problems are really ongoing since sometime last year.  These became increased since earlier this year especially pain in her wrists and elbows.  There is swelling and pain with decreased range of motion in the right wrist she does not recall exactly when this started.  She was seen at the emergency department a month ago at that time pain and swelling were suspected to be soft tissue injury from a fall that she had about 2 weeks before the visit.  She was given a right wrist brace symptoms have remained persistent for a month since that time.  She continues to have pain worst in the right greater than left wrists bilaterally also of the right elbow.  She takes Cymbalta for mood symptoms without recent modification no other anti-inflammatory or analgesic medications.  She has taken minocycline for acne. Besides joint pains she does describe chronic  fatigue difficulty sleeping well.  She has intermittent lymphadenopathy most commonly in cervical distribution.  She denies symptoms of photosensitive rashes, pleurisy, has no history of blood clots and has never been pregnant.   Labs reviewed 02/2021 LFTs wnl CRP 5.9 HBV neg HCV neg   01/2021 CBC wnl BMP wnl Uric acid 4.6 UA +protein   11/2020 Ferritin 11 Vit D 19 dsDNA neg RF neg CCP neg ANA neg ESR 2 CRP 0.6 NG/CT neg   Review of Systems  Constitutional:  Negative for fatigue.  HENT:  Negative for mouth sores, mouth dryness and nose dryness.   Eyes:  Negative for pain, itching, visual disturbance and dryness.  Respiratory:  Negative for cough, hemoptysis, shortness of breath and difficulty breathing.   Cardiovascular:  Positive for chest pain. Negative for palpitations and swelling in legs/feet.  Gastrointestinal:  Negative for abdominal pain, blood in stool, constipation and diarrhea.  Endocrine: Negative for increased urination.  Genitourinary:  Negative for painful urination.  Musculoskeletal:  Positive for joint pain, joint pain, joint swelling and morning stiffness. Negative for myalgias, muscle weakness, muscle tenderness and myalgias.  Skin:  Negative for color change, rash and redness.  Allergic/Immunologic: Negative for susceptible to infections.  Neurological:  Positive for headaches. Negative for dizziness, numbness, memory loss and weakness.  Hematological:  Positive for swollen glands.  Psychiatric/Behavioral:  Negative for confusion and sleep disturbance.  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   Nexplanon in place 11/18/2017   Attention deficit hyperactivity disorder (ADHD), 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   Adjustment disorder 10/22/2017    Past Medical History:  Diagnosis Date   ADHD (attention deficit hyperactivity disorder)    Anxiety    Phreesia 05/08/2020   Depression    Phreesia 05/08/2020   Hypertension    Per patient   Ingrown left big toenail 01/16/2015    Family History  Problem Relation Age of Onset   Hypertension Mother    Hypertension Father    Hyperlipidemia Father    Bipolar disorder Brother    ADD / ADHD Brother    ADD / ADHD Brother    Eating disorder Maternal Aunt    Hyperlipidemia Paternal Aunt    Hyperlipidemia Maternal Grandmother    Thyroid disease Maternal Grandmother    Thyroid disease Maternal Great-grandmother    Rheum arthritis Maternal Great-grandmother    Past Surgical History:  Procedure Laterality Date   LABIOPLASTY N/A 03/21/2018   Procedure: LABIAPLASTY;  Surgeon: Irene Limbo, MD;  Location: [facility];  Service: Plastics;  Laterality: N/A;   STRABISMUS SURGERY Bilateral 04/16/2003   WISDOM TOOTH EXTRACTION     Social History   Social History Narrative   Not on file   Immunization History  Administered Date(s) Administered   PFIZER(Purple Top)SARS-COV-2 Vaccination 09/02/2020     Objective: Vital Signs: BP (!) 129/94 (BP Location: Left Arm, Patient Position: Sitting, Cuff Size: Normal)   Pulse 94   Ht _0  (1.575 m)   Wt 108 lb 9.6 oz (49.3 kg)   BMI 19.86 kg/m    Physical Exam Skin:    General: Skin is warm and dry.     Findings: No rash.  Neurological:     General: No focal deficit present.     Mental Status: She is alert.  Psychiatric:        Mood and Affect: Mood normal.      Musculoskeletal Exam:  Shoulders full ROM no tenderness or swelling Elbows full ROM no tenderness or swelling Right wrist with significant overlying soft tissue swelling and slightly decreased flexion and extension range of motion with no erythema or warmth, left wrist normal range of motion no tenderness or swelling Fingers full ROM no tenderness or swelling Knees full ROM no tenderness or swelling Ankles full ROM no tenderness or swelling   Investigation: No additional findings.  Imaging: No results found.  Recent Labs: Lab Results  Component Value Date   WBC 8.7 02/22/2021   HGB 13.4 02/22/2021   PLT 346 02/22/2021   NA 139 02/02/2021   K 4.3 02/02/2021   CL 103 02/02/2021   CO2 25 02/02/2021   GLUCOSE 73 02/02/2021   BUN 19 02/02/2021   CREATININE 0.73 02/02/2021   BILITOT 0.6 02/22/2021   ALKPHOS 123 04/04/2009   AST 14 02/22/2021   ALT 8 02/22/2021  PROT 7.1 02/22/2021   ALBUMIN 3.0 (L) 04/04/2009   CALCIUM 9.6 02/02/2021   GFRAA  04/04/2009    NOT CALCULATED        The eGFR has been calculated using the MDRD equation. This calculation has not been validated in all clinical situations. eGFR's persistently <60 mL/min signify possible Chronic Kidney Disease.    Speciality Comments: No specialty comments available.  Procedures:  No procedures performed Allergies: Amoxicillin   Assessment / Plan:     Visit Diagnoses: Seronegative inflammatory arthritis - Plan: hydroxychloroquine (PLAQUENIL) 200 MG tablet  So far negative serology except for the high inflammatory markers.  I do not particularly suspect infectious arthritis at this time given the duration without progression no erythema or rashes no other systemic symptoms.  Recommend starting hydroxychloroquine 200 mg p.o. daily for a seronegative inflammatory arthritis at this time.  Discussed she can continue use of NSAIDs as needed.  Discussed risk and benefits of medication including sensitivity  reactions, and need for retinal toxicity monitoring.  Long-term use of hydroxychloroquine - Plan: Ambulatory referral to Ophthalmology  Not clear anticipated duration of treatment at this time but will refer to ophthalmology for baseline hydroxychloroquine retinal toxicity screening.  Orders: Orders Placed This Encounter  Procedures   Ambulatory referral to Ophthalmology    Meds ordered this encounter  Medications   hydroxychloroquine (PLAQUENIL) 200 MG tablet    Sig: Take 1 tablet (200 mg total) by mouth daily.    Dispense:  30 tablet    Refill:  2      Follow-Up Instructions: Return in about 2 months (around 06/24/2021) for Arthritis HCQ start f/u 42mo.   CCollier Salina MD  Note - This record has been created using [employer]  Chart creation errors have been sought, but may not always  have been located. Such creation errors do not reflect on  the standard of medical care.

## 2021-04-24 ENCOUNTER — Ambulatory Visit: Payer: Medicaid Other | Admitting: Internal Medicine

## 2021-04-24 ENCOUNTER — Encounter: Payer: Self-pay | Admitting: Internal Medicine

## 2021-04-24 ENCOUNTER — Other Ambulatory Visit: Payer: Self-pay

## 2021-04-24 VITALS — BP 129/94 | HR 94 | Ht 62.0 in | Wt 108.6 lb

## 2021-04-24 DIAGNOSIS — M1380 Other specified arthritis, unspecified site: Secondary | ICD-10-CM | POA: Diagnosis not present

## 2021-04-24 DIAGNOSIS — Z79899 Other long term (current) drug therapy: Secondary | ICD-10-CM | POA: Insufficient documentation

## 2021-04-24 MED ORDER — HYDROXYCHLOROQUINE SULFATE 200 MG PO TABS: 200.0000 mg | ORAL_TABLET | Freq: Every day | ORAL | 2 refills | Status: AC

## 2021-04-25 ENCOUNTER — Ambulatory Visit: Payer: Medicaid Other | Admitting: Pediatrics

## 2021-05-08 ENCOUNTER — Other Ambulatory Visit: Payer: Self-pay | Admitting: Pediatrics

## 2021-05-08 DIAGNOSIS — F902 Attention-deficit hyperactivity disorder, combined type: Secondary | ICD-10-CM

## 2021-05-08 MED ORDER — METHYLPHENIDATE HCL ER (XR) 15 MG PO CP24
15.0000 mg | ORAL_CAPSULE | Freq: Every day | ORAL | 0 refills | Status: AC
Start: 2021-05-08 — End: ?

## 2021-06-02 ENCOUNTER — Other Ambulatory Visit: Payer: Self-pay | Admitting: Pediatrics

## 2021-06-02 DIAGNOSIS — F4323 Adjustment disorder with mixed anxiety and depressed mood: Secondary | ICD-10-CM

## 2021-06-20 ENCOUNTER — Other Ambulatory Visit: Payer: Self-pay

## 2021-06-20 DIAGNOSIS — F4323 Adjustment disorder with mixed anxiety and depressed mood: Secondary | ICD-10-CM

## 2021-06-20 MED ORDER — SERTRALINE HCL 50 MG PO TABS: 50.0000 mg | ORAL_TABLET | Freq: Every day | ORAL | 2 refills | Status: AC

## 2021-06-25 NOTE — Progress Notes (Deleted)
Office Visit Note  Patient: Gail Reed             Date of Birth: [birth year]           MRN: [id number]             PCP: Trude Mcburney, FNP Referring: Trude Mcburney, FNP Visit Date: 06/26/2021   Subjective:  No chief complaint on file.   History of Present Illness: Gail Reed is a 20 y.o. female here for follow up for seronegative inflammatory arthritis with right wrist swelling and highly elevated inflammatory markers after starting hydroxychloroquine 200 mg PO daily.***   Previous HPI 04/24/21 Gail Reed is a 21 y.o. female here for follow up for chronic joint pain with right wrist swelling seen at our last visit with markedly high ESR and CRP. Suspicious for seronegative inflammatory arthritis and recommended trial of oral prednisone for about 2 weeks.  She feels the prednisone improved her joint pain and stiffness but she continues to have a comparable amount of swelling as before.  Otherwise no significant change in symptoms.   Previous HPI: 04/05/21 Gail Reed is a 20 y.o. female with a history of recurrent infectious mononucleosis, depression and anxiety here for evaluation of chronic joint pain in multiple sites.  She has had chronic joint pains previously but current problems are really ongoing since sometime last year.  These became increased since earlier this year especially pain in her wrists and elbows.  There is swelling and pain with decreased range of motion in the right wrist she does not recall exactly when this started.  She was seen at the emergency department a month ago at that time pain and swelling were suspected to be soft tissue injury from a fall that she had about 2 weeks before the visit.  She was given a right wrist brace symptoms have remained persistent for a month since that time.  She continues to have pain worst in the right greater than left wrists bilaterally also of the right elbow.  She takes Cymbalta for mood symptoms  without recent modification no other anti-inflammatory or analgesic medications.  She has taken minocycline for acne. Besides joint pains she does describe chronic fatigue difficulty sleeping well.  She has intermittent lymphadenopathy most commonly in cervical distribution.  She denies symptoms of photosensitive rashes, pleurisy, has no history of blood clots and has never been pregnant.   Labs reviewed 02/2021 LFTs wnl CRP 5.9 HBV neg HCV neg   01/2021 CBC wnl BMP wnl Uric acid 4.6 UA +protein   11/2020 Ferritin 11 Vit D 19 dsDNA neg RF neg CCP neg ANA neg ESR 2 CRP 0.6 NG/CT neg   No Rheumatology ROS completed.   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   Nexplanon in place 11/18/2017   Attention deficit hyperactivity disorder (ADHD), 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   Adjustment disorder 10/22/2017    Past Medical History:  Diagnosis Date   ADHD (attention deficit hyperactivity disorder)    Anxiety    Phreesia 05/08/2020   Depression    Phreesia 05/08/2020   Hypertension    Per patient   Ingrown left big toenail 01/16/2015    Family History  Problem Relation Age of Onset   Hypertension Mother    Hypertension Father    Hyperlipidemia Father    Bipolar disorder Brother    ADD / ADHD Brother    ADD / ADHD Brother    Eating disorder Maternal Aunt    Hyperlipidemia Paternal Aunt    Hyperlipidemia  Maternal Grandmother    Thyroid disease Maternal Grandmother    Thyroid disease Maternal Great-grandmother    Rheum arthritis Maternal Great-grandmother    Past Surgical History:  Procedure Laterality Date   LABIOPLASTY N/A 03/21/2018   Procedure: LABIAPLASTY;  Surgeon: Irene Limbo, MD;  Location: [facility];  Service: Plastics;  Laterality: N/A;   STRABISMUS SURGERY Bilateral 04/16/2003   WISDOM TOOTH EXTRACTION     Social History   Social History Narrative   Not on file   Immunization History  Administered Date(s) Administered   PFIZER(Purple Top)SARS-COV-2 Vaccination 09/02/2020     Objective: Vital Signs: There were no vitals taken for this visit.   Physical Exam   Musculoskeletal Exam: ***  CDAI Exam: CDAI Score: -- Patient Global: --; Provider Global: -- Swollen: --; Tender: -- Joint Exam 06/26/2021   No joint exam has been documented for this visit   There is currently no information documented on the homunculus. Go to the Rheumatology activity and complete the homunculus joint exam.  Investigation: No additional findings.  Imaging: No results found.  Recent Labs: Lab Results  Component Value Date   WBC 8.7 02/22/2021   HGB 13.4 02/22/2021   PLT 346 02/22/2021   NA 139 02/02/2021   K 4.3 02/02/2021   CL 103 02/02/2021   CO2 25 02/02/2021   GLUCOSE 73 02/02/2021   BUN 19 02/02/2021   CREATININE 0.73 02/02/2021   BILITOT 0.6 02/22/2021   ALKPHOS 123 04/04/2009   AST 14 02/22/2021   ALT 8 02/22/2021   PROT 7.1 02/22/2021   ALBUMIN 3.0 (L) 04/04/2009   CALCIUM 9.6 02/02/2021   GFRAA  04/04/2009    NOT CALCULATED        The eGFR has been calculated using the MDRD equation. This calculation has not been validated in all clinical situations. eGFR's persistently <60 mL/min signify possible Chronic Kidney Disease.    Speciality Comments: No specialty comments available.  Procedures:  No procedures performed Allergies:  Amoxicillin   Assessment / Plan:     Visit Diagnoses: No diagnosis found.  ***  Orders: No orders of the defined types were placed in this encounter.  No orders of the defined types were placed in this encounter.    Follow-Up Instructions: No follow-ups on file.   Collier Salina, MD  Note - This record has been created using [employer].  Chart creation errors have been sought, but may not always  have been located. Such creation errors do not reflect on  the standard of medical care.

## 2021-06-26 ENCOUNTER — Ambulatory Visit: Payer: Medicaid Other | Admitting: Internal Medicine

## 2021-08-01 ENCOUNTER — Ambulatory Visit: Payer: Medicaid Other | Admitting: Pediatrics

## 2022-01-18 ENCOUNTER — Encounter (HOSPITAL_COMMUNITY): Payer: Self-pay | Admitting: Emergency Medicine

## 2022-01-18 ENCOUNTER — Ambulatory Visit (HOSPITAL_COMMUNITY)
Admission: EM | Admit: 2022-01-18 | Discharge: 2022-01-18 | Disposition: A | Discharge status: Home / Self Care | Payer: Medicaid Other | Attending: Family Medicine | Admitting: Family Medicine

## 2022-01-18 ENCOUNTER — Other Ambulatory Visit: Payer: Self-pay

## 2022-01-18 DIAGNOSIS — R0789 Other chest pain: Secondary | ICD-10-CM | POA: Insufficient documentation

## 2022-01-18 DIAGNOSIS — Z20822 Contact with and (suspected) exposure to covid-19: Secondary | ICD-10-CM | POA: Diagnosis not present

## 2022-01-18 DIAGNOSIS — K529 Noninfective gastroenteritis and colitis, unspecified: Secondary | ICD-10-CM | POA: Diagnosis not present

## 2022-01-18 DIAGNOSIS — J069 Acute upper respiratory infection, unspecified: Secondary | ICD-10-CM | POA: Diagnosis not present

## 2022-01-18 LAB — SARS CORONAVIRUS 2 (TAT 6-24 HRS): SARS Coronavirus 2: NEGATIVE

## 2022-01-18 NOTE — Discharge Instructions (Addendum)
Take tylenol as needed for your chest wall pain. ? ?Ondansetron dissolved in the mouth every 8 hours as needed for nausea or vomiting. ?Clear liquids and bland things to eat.  ? ? ? ?You have been swabbed for COVID, and the test will result in the next 24 hours. Our staff will call you if positive. If the test is positive, you should quarantine for 5 days.  ?

## 2022-01-18 NOTE — ED Provider Notes (Signed)
?[facility] ? ? ? ?CSN: [id number] ?Arrival date & time: 01/18/22  1116 ? ? ?  ? ?History   ?Chief Complaint ?Chief Complaint  ?Patient presents with  ? Abdominal Pain  ? ? ?HPI ?Gail Reed is a 21 y.o. female.  ? ? ?Abdominal Pain ?Here for pleuritic chest pain that began yesterday.  2 nights ago she had frequent vomiting approximately 8-10 times over the night of May 2.  She had loose stools about 10-12 times and that 24 hours, and that has improved.  She did have a bowel movement this morning.  Last emesis was yesterday morning early.  She is still nauseated some.  She has still been drinking. ?About 2 days ago she noticed she could not taste anything.  She has had a little bit of cough and nasal congestion that began yesterday.  She did have some subjective fever. ? ?Menses are regular due to her having the Nexplanon. ? ?Past medical history includes seronegative inflammatory arthritis, and she takes hydroxychloroquine ? ?He has done to home COVID test that were negative ? ? ?Past Medical History:  ?Diagnosis Date  ? ADHD (attention deficit hyperactivity disorder)   ? Anxiety   ? Phreesia 05/08/2020  ? Depression   ? Phreesia 05/08/2020  ? Hypertension   ? Per patient  ? Ingrown left big toenail 01/16/2015  ? ? ?Patient Active Problem List  ? Diagnosis Date Noted  ? 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 Long-term use of hydroxychloroquine 04/24/2021  ? Wrist swelling 04/05/2021  ? Pain in right elbow 04/05/2021  ? 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 Arthralgia 01/17/2021  ? Lymphadenopathy 01/17/2021  ? Weight loss 01/17/2021  ? Sleep deficient 10/28/2019  ? Acne 10/28/2019  ? Decreased appetite 10/28/2019  ? Sinus tachycardia 09/25/2018  ? Abnormal EKG 09/25/2018  ? Elevated BP without diagnosis of hypertension 04/28/2018  ? 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 Nexplanon in place 11/18/2017  ? Attention deficit hyperactivity disorder (ADHD),  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  ? Adjustment disorder 10/22/2017  ? ? ?Past Surgical History:  ?Procedure Laterality Date  ? LABIOPLASTY N/A 03/21/2018  ? Procedure: LABIAPLASTY;  Surgeon: Glenna Fellows, MD;  Location: [facility];  Service: Plastics;  Laterality: N/A;  ? STRABISMUS SURGERY Bilateral 04/16/2003  ? WISDOM TOOTH EXTRACTION    ? ? ?OB History   ?No obstetric history on file. ?  ? ? ? ?Home Medications   ? ?Prior to Admission medications   ?Medication Sig Start Date End Date Taking? Authorizing Provider  ?sertraline (ZOLOFT) 50 MG tablet Take 1 tablet (50 mg total) by mouth daily. ?Patient not taking: Reported on 01/18/2022 06/20/21   Verneda Skill, FNP  ?hydroxychloroquine (PLAQUENIL) 200 MG tablet Take 1 tablet (200 mg total) by mouth daily. ?Patient not taking: Reported on 01/18/2022 04/24/21   Fuller Plan, MD  ?Ibuprofen (ADVIL PO) Take by mouth as needed.    [provider]  ?Methylphenidate HCl ER, XR, (APTENSIO XR) 15 MG CP24 Take 15 mg by mouth daily. ?Patient not taking: Reported on 01/18/2022 05/08/21   Verneda Skill, FNP  ?Vitamin D, Ergocalciferol, (DRISDOL) 1.25 MG (50000 UNIT) CAPS capsule TAKE 1 CAPSULE (50,000 UNITS TOTAL) BY MOUTH EVERY 7 (SEVEN) DAYS ?Patient not taking: No sig reported 01/23/21   Verneda Skill, FNP  ? ? ?Family History ?Family History  ?Problem Relation Age of Onset  ? Hypertension Mother   ? Hypertension Father   ?  Hyperlipidemia Father   ? Bipolar disorder Brother   ? ADD / ADHD Brother   ? ADD / ADHD Brother   ? Eating disorder Maternal Aunt   ? Hyperlipidemia Paternal Aunt   ? Hyperlipidemia Maternal Grandmother   ? Thyroid disease Maternal Grandmother   ? Thyroid disease Maternal Great-grandmother   ? Rheum arthritis Maternal Great-grandmother   ? ? ?Social History ?Social History  ? ?Tobacco Use  ? Smoking status: Light Smoker  ? Smokeless tobacco: Never  ?Vaping Use  ? Vaping Use: Some days  ? Substances: Nicotine  ?Substance Use  Topics  ? Alcohol use: Yes  ? Drug use: Not Currently  ?  Types: Marijuana  ? ? ? ?Allergies   ?Amoxicillin ? ? ?Review of Systems ?Review of Systems  ?Gastrointestinal:  Positive for abdominal pain.  ? ? ?Physical Exam ?Triage Vital Signs ?ED Triage Vitals  ?Enc Vitals Group  ?   BP 01/18/22 1226 (!) 140/100  ?   Pulse Rate 01/18/22 1226 (!) 101  ?   Resp 01/18/22 1226 20  ?   Temp 01/18/22 1226 98.4 ?F (36.9 ?C)  ?   Temp Source 01/18/22 1226 Oral  ?   SpO2 01/18/22 1226 100 %  ?   Weight --   ?   Height --   ?   Head Circumference --   ?   Peak Flow --   ?   Pain Score 01/18/22 1222 4  ?   Pain Loc --   ?   Pain Edu? --   ?   Excl. in GC? --   ? ?No data found. ? ?Updated Vital Signs ?BP (!) 126/93 (BP Location: Left Arm) Comment (BP Location): repositioned  Pulse (!) 101   Temp 98.4 ?F (36.9 ?C) (Oral)   Resp 20   SpO2 100%  ? ?Visual Acuity ?Right Eye Distance:   ?Left Eye Distance:   ?Bilateral Distance:   ? ?Right Eye Near:   ?Left Eye Near:    ?Bilateral Near:    ? ?Physical Exam ?Vitals reviewed.  ?Constitutional:   ?   General: She is not in acute distress. ?   Appearance: She is not ill-appearing, toxic-appearing or diaphoretic.  ?HENT:  ?   Right Ear: Tympanic membrane and ear canal normal.  ?   Left Ear: Tympanic membrane and ear canal normal.  ?   Nose: Nose normal.  ?   Mouth/Throat:  ?   Mouth: Mucous membranes are moist.  ?   Pharynx: No oropharyngeal exudate or posterior oropharyngeal erythema.  ?Eyes:  ?   Extraocular Movements: Extraocular movements intact.  ?   Conjunctiva/sclera: Conjunctivae normal.  ?   Pupils: Pupils are equal, round, and reactive to light.  ?Cardiovascular:  ?   Rate and Rhythm: Normal rate and regular rhythm.  ?   Heart sounds: No murmur heard. ?Pulmonary:  ?   Effort: Pulmonary effort is normal.  ?   Breath sounds: Normal breath sounds. No stridor. No wheezing, rhonchi or rales.  ?Chest:  ?   Chest wall: Tenderness (left anterior chest at about 4th intercostal space)  present.  ?Abdominal:  ?   General: Abdomen is flat.  ?   Palpations: Abdomen is soft. There is no mass.  ?   Tenderness: There is no abdominal tenderness. There is no guarding.  ?Musculoskeletal:  ?   Cervical back: Neck supple.  ?Lymphadenopathy:  ?   Cervical: No cervical adenopathy.  ?Skin: ?  Coloration: Skin is not jaundiced or pale.  ?   Findings: No rash.  ?Neurological:  ?   General: No focal deficit present.  ?   Mental Status: She is alert and oriented to person, place, and time.  ?Psychiatric:     ?   Behavior: Behavior normal.  ? ? ? ?UC Treatments / Results  ?Labs ?(all labs ordered are listed, but only abnormal results are displayed) ?Labs Reviewed  ?SARS CORONAVIRUS 2 (TAT 6-24 HRS)  ? ? ?EKG ? ? ?Radiology ?No results found. ? ?Procedures ?Procedures (including critical care time) ? ?Medications Ordered in UC ?Medications - No data to display ? ?Initial Impression / Assessment and Plan / UC Course  ?I have reviewed the triage vital signs and the nursing notes. ? ?Pertinent labs & imaging results that were available during my care of the patient were reviewed by me and considered in my medical decision making (see chart for details). ? ?  ? ?I will supply some Zofran for her nausea.  COVID swab done today with the loss of taste.  If she is positive, it appears that she is high risk for severe disease with her being on the hydroxychloroquine or at least having inflammatory arthritis.  If positive I think she should have a prescription for Paxlovid.  If there is an interaction with her medications and Paxlovid, she should have molnupiravir ? ?The chest pain appears to be costochondritis/chest wall pain ?Final Clinical Impressions(s) / UC Diagnoses  ? ?Final diagnoses:  ?Gastroenteritis  ?Viral URI  ?Chest wall pain  ? ? ? ?Discharge Instructions   ? ?  ?Take tylenol as needed for your chest wall pain. ? ?Ondansetron dissolved in the mouth every 8 hours as needed for nausea or vomiting. ?Clear liquids  and bland things to eat.  ? ? ? ?You have been swabbed for COVID, and the test will result in the next 24 hours. Our staff will call you if positive. If the test is positive, you should quarantine for 5 days.  ?

## 2022-01-18 NOTE — ED Triage Notes (Signed)
Started feeling sick 2 days ago with nausea, vomiting and diarrhea.  Patient has been able to eat minimally today.  Center chest soreness with deep breaths, cough, or laughing, no more vomiting today, continues to have nausea.  No more diarrhea today ?

## 2022-01-20 ENCOUNTER — Telehealth (HOSPITAL_COMMUNITY): Payer: Self-pay | Admitting: Internal Medicine

## 2022-01-20 MED ORDER — ONDANSETRON 4 MG PO TBDP: 4.0000 mg | ORAL_TABLET | Freq: Three times a day (TID) | ORAL | 0 refills | Status: DC | PRN

## 2022-01-20 NOTE — Telephone Encounter (Signed)
Her pharmacy never received the Zofran rx. I sent it.  ?

## 2022-01-31 ENCOUNTER — Other Ambulatory Visit: Payer: Self-pay | Admitting: Pediatrics

## 2022-01-31 DIAGNOSIS — F4323 Adjustment disorder with mixed anxiety and depressed mood: Secondary | ICD-10-CM

## 2022-08-03 IMAGING — CR DG CHEST 2V
2 series · 2 of 2 positions shown · non-contrast
Comparison: None.

CLINICAL DATA: Lymphadenopathy. Chest pains and shortness of
breath.

EXAM:
CHEST - 2 VIEW

[w chest pa]
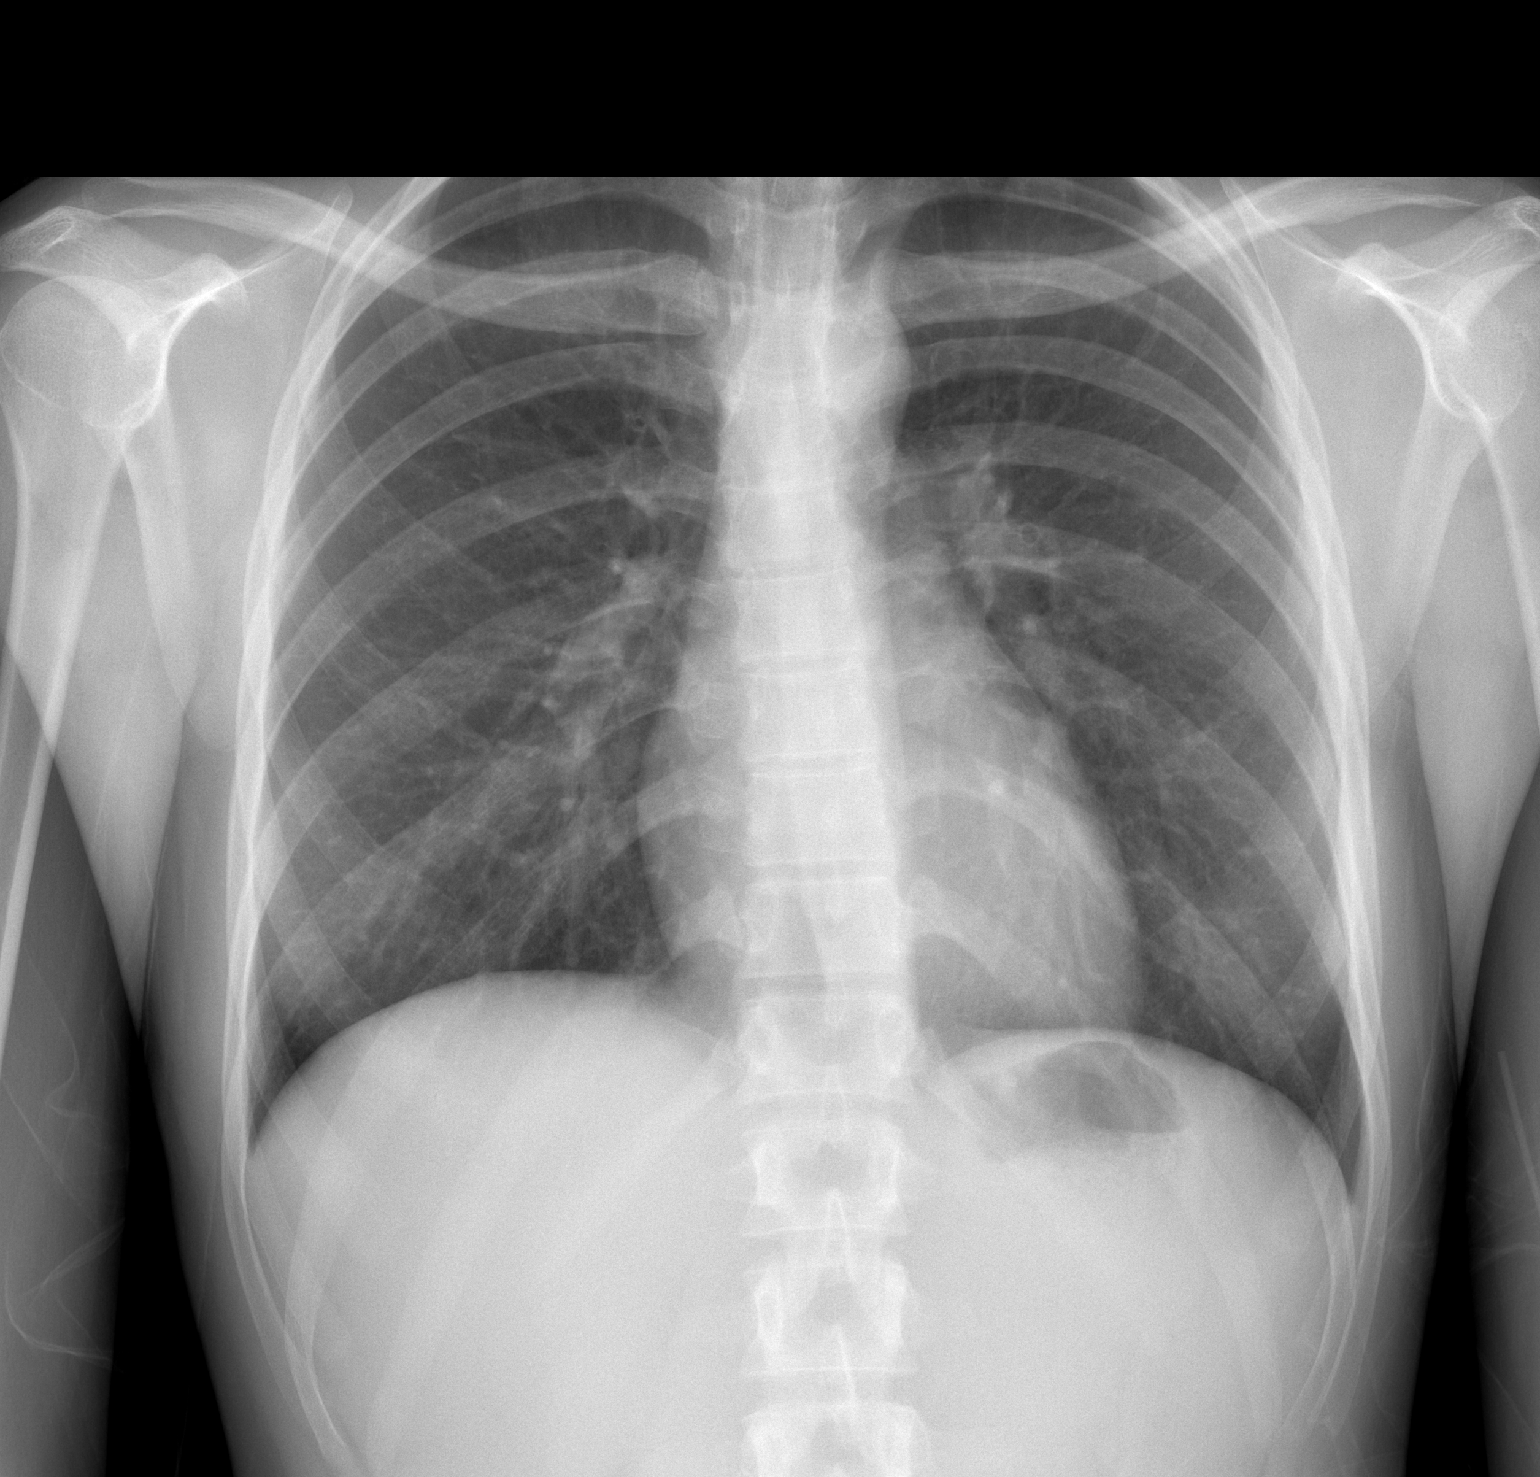

[w chest lat]
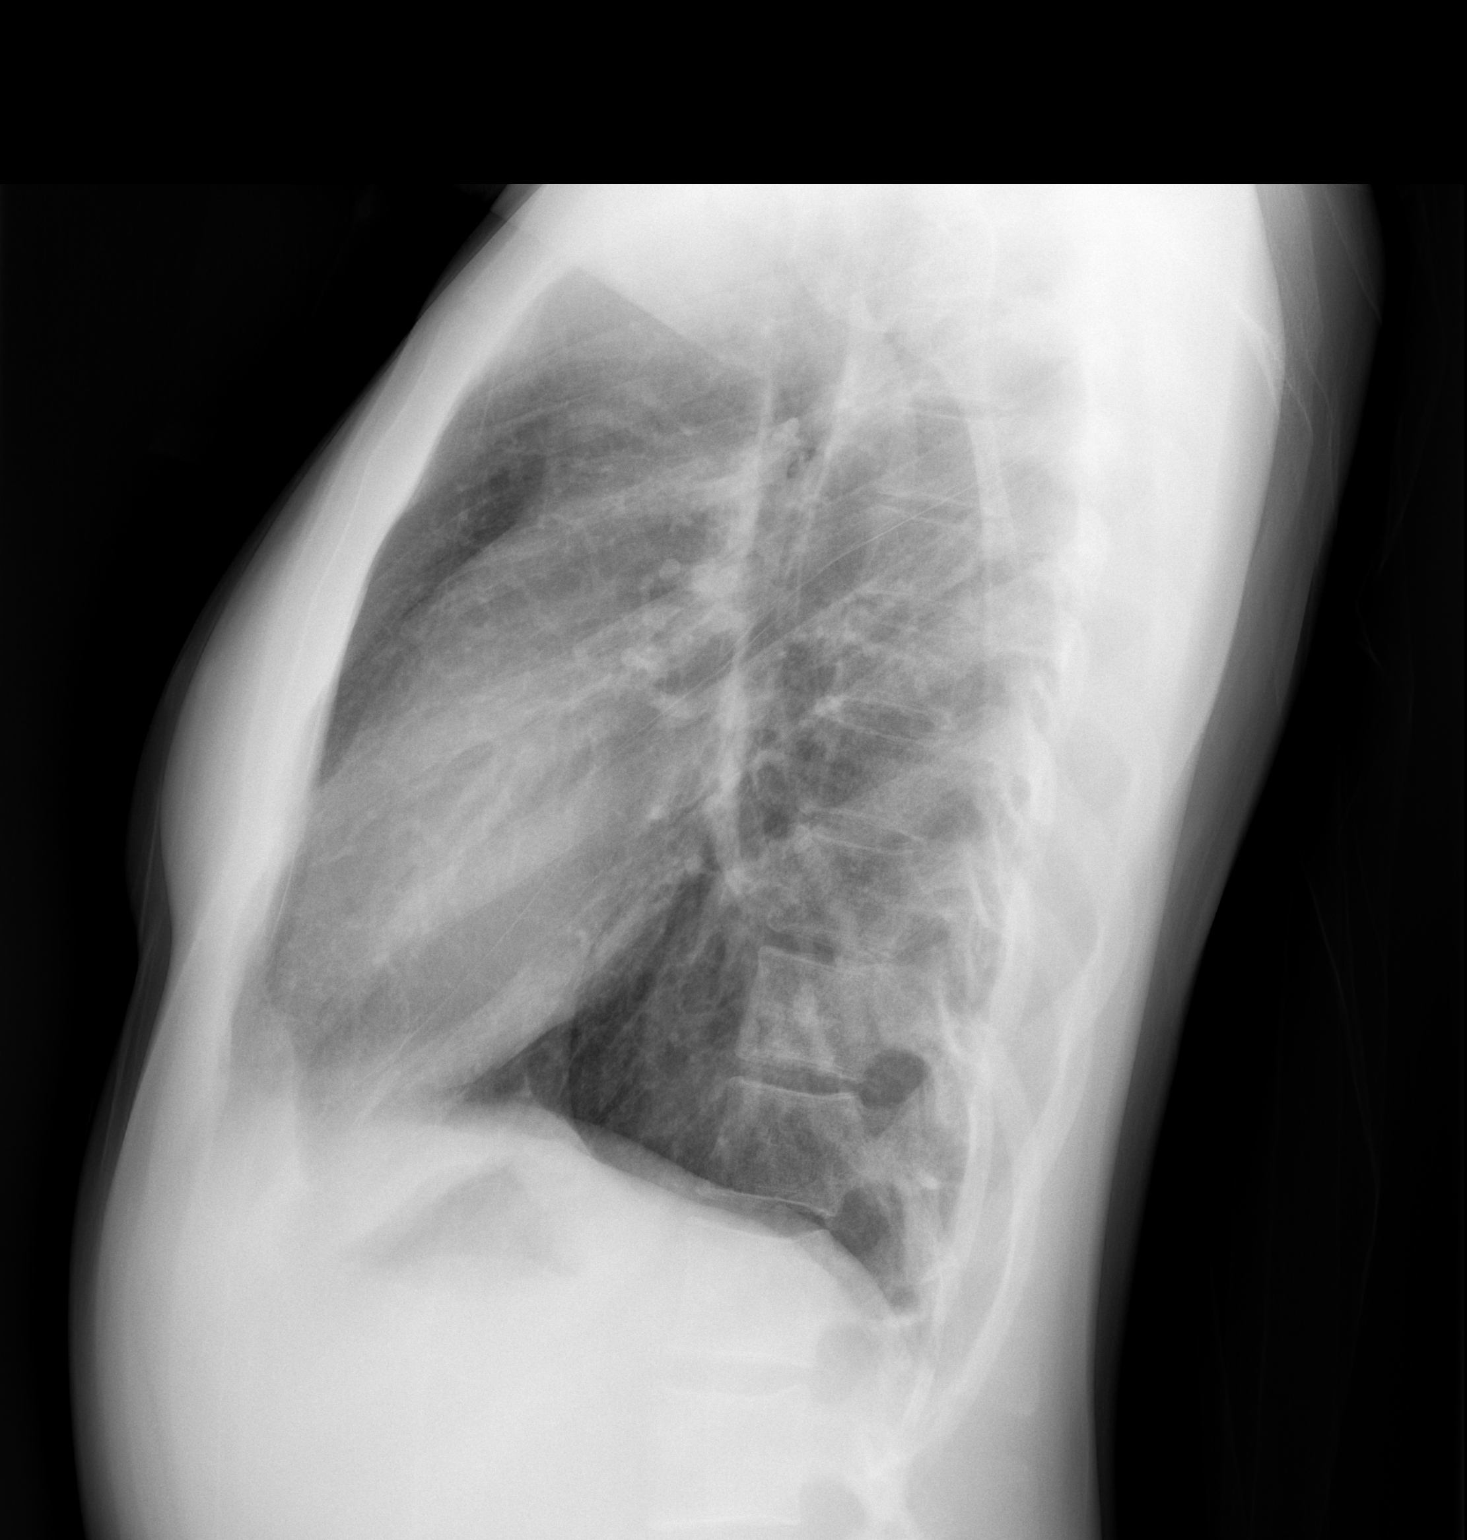

[2 of 2 positions shown; findings below may reference images not displayed]

FINDINGS: The heart size and mediastinal contours are within normal limits.
Both lungs are clear. The visualized skeletal structures are
unremarkable.
IMPRESSION: No active cardiopulmonary disease.

## 2022-09-27 DIAGNOSIS — Z6820 Body mass index (BMI) 20.0-20.9, adult: Secondary | ICD-10-CM | POA: Diagnosis not present

## 2022-09-27 DIAGNOSIS — H6992 Unspecified Eustachian tube disorder, left ear: Secondary | ICD-10-CM | POA: Diagnosis not present

## 2022-09-27 DIAGNOSIS — R030 Elevated blood-pressure reading, without diagnosis of hypertension: Secondary | ICD-10-CM | POA: Diagnosis not present

## 2022-09-27 DIAGNOSIS — H6501 Acute serous otitis media, right ear: Secondary | ICD-10-CM | POA: Diagnosis not present

## 2022-11-28 ENCOUNTER — Encounter (HOSPITAL_COMMUNITY): Payer: Self-pay | Admitting: *Deleted

## 2022-11-28 ENCOUNTER — Ambulatory Visit (HOSPITAL_COMMUNITY)
Admission: EM | Admit: 2022-11-28 | Discharge: 2022-11-28 | Disposition: A | Discharge status: Home / Self Care | Payer: 59 | Attending: Family Medicine | Admitting: Family Medicine

## 2022-11-28 ENCOUNTER — Ambulatory Visit (INDEPENDENT_AMBULATORY_CARE_PROVIDER_SITE_OTHER): Payer: 59

## 2022-11-28 ENCOUNTER — Other Ambulatory Visit: Payer: Self-pay

## 2022-11-28 DIAGNOSIS — R079 Chest pain, unspecified: Secondary | ICD-10-CM

## 2022-11-28 MED ORDER — DICLOFENAC SODIUM 75 MG PO TBEC: 75.0000 mg | DELAYED_RELEASE_TABLET | Freq: Two times a day (BID) | ORAL | 0 refills | Status: DC

## 2022-11-28 NOTE — ED Triage Notes (Signed)
PT reports CP for 3 months along with acid reflux.Pt also has a cough. Pt reports LT sided CP is intermittent .

## 2022-11-28 NOTE — ED Provider Notes (Signed)
[facility]   [id number] 11/28/22 Arrival Time: 1402  ASSESSMENT & PLAN:  1. Chest pain, unspecified type    Reports this pain has been present for several months. Occasional smoker; questions relation. 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 Leaning toward MSK cause of pain. No suspicion of PE.  ECG: Performed today and interpreted by me: no STEMI; normal sinus rhythm. Inverted T-waves in I, AVL but doubt this has to do with current long-standing symptoms.  I have personally viewed and independently interpreted the imaging studies ordered this visit. CXR: no infiltrates; no pneumothorax.  Work note provided. Begin trial of: Meds ordered this encounter  Medications   diclofenac (VOLTAREN) 75 MG EC tablet    Sig: Take 1 tablet (75 mg total) by mouth 2 (two) times daily.    Dispense:  14 tablet    Refill:  0    Reviewed expectations re: course of current medical issues. Questions answered. Outlined signs and symptoms indicating need for more acute intervention. Patient verbalized understanding. After Visit Summary given.   SUBJECTIVE: History from: patient. Gail Reed is a 22 y.o. female who presents with complaint of non-radiating LEFT sided mid-axillary chest pain; sporadic; first noted a few months ago. Denies assoc SOB/n/v/diaphoresis. Pain does affect sleep at times. Denies chest trauma. Has been seen previously for this. "Can't tell me what's causing it." Does have days where she doesn't feel pain. Denies: irregular heart beat, lower extremity edema, near-syncope, orthopnea, palpitations, and paroxysmal nocturnal dyspnea. No specific aggravating or alleviating factors reported.  Recent illnesses: none. Fever: denies. Ambulatory without assistance. Occasional ibuprofen that does seem to help. Denies recreational drug use.  Social History   Tobacco Use  Smoking Status Light Smoker  Smokeless Tobacco Never  Does vape.  Social  History   Substance and Sexual Activity  Alcohol Use Yes     OBJECTIVE:  Vitals:   11/28/22 1432  BP: 132/89  Pulse: 85  Resp: 18  Temp: 98.1 F (36.7 C)  SpO2: 100%    General appearance: alert, oriented, no acute distress Eyes: PERRLA; EOMI; conjunctivae normal HENT: normocephalic; atraumatic Neck: supple with FROM Lungs: without labored respirations; speaks full sentences without difficulty; CTAB Heart: regular rate and rhythm without murmer Chest Wall: without tenderness to palpation Abdomen: soft, non-tender; no guarding or rebound tenderness Extremities: without edema; without calf swelling or tenderness; symmetrical without gross deformities Skin: warm and dry; without rash or lesions Neuro: normal gait Psychological: alert and cooperative; normal mood and affect  Imaging: DG Chest 2 View  Result Date: 11/28/2022 CLINICAL DATA:  2-3 months of chest pain EXAM: CHEST - 2 VIEW COMPARISON:  Chest radiograph Feb 02, 2021. FINDINGS: The heart size and mediastinal contours are within normal limits. No focal consolidation. No pleural effusion. No pneumothorax. The visualized skeletal structures are unremarkable. IMPRESSION: No active cardiopulmonary disease. Electronically Signed   By: Dahlia Bailiff M.D.   On: 11/28/2022 15:29     Allergies  Allergen Reactions   Amoxicillin Rash    AS A CHILD    Past Medical History:  Diagnosis Date   ADHD (attention deficit hyperactivity disorder)    Anxiety    Phreesia 05/08/2020   Depression    Phreesia 05/08/2020   Hypertension    Per patient   Ingrown left big toenail 01/16/2015   Social History   Socioeconomic History   Marital status: Single    Spouse name: Not on file   Number of children: Not  on file   Years of education: Not on file   Highest education level: Not on file  Occupational History   Not on file  Tobacco Use   Smoking status: Light Smoker   Smokeless tobacco: Never  Vaping Use   Vaping Use: Some  days   Substances: Nicotine  Substance and Sexual Activity   Alcohol use: Yes   Drug use: Not Currently    Types: Marijuana   Sexual activity: Yes    Birth control/protection: Implant  Other Topics Concern   Not on file  Social History Narrative   Not on file   Social Determinants of Health   Financial Resource Strain: Not on file  Food Insecurity: Not on file  Transportation Needs: Not on file  Physical Activity: Not on file  Stress: Not on file  Social Connections: Not on file  Intimate Partner Violence: Not on file   Family History  Problem Relation Age of Onset   Hypertension Mother    Hypertension Father    Hyperlipidemia Father    Bipolar disorder Brother    ADD / ADHD Brother    ADD / ADHD Brother    Eating disorder Maternal Aunt    Hyperlipidemia Paternal Aunt    Hyperlipidemia Maternal Grandmother    Thyroid disease Maternal Grandmother    Thyroid disease Maternal Great-grandmother    Rheum arthritis Maternal Great-grandmother    Past Surgical History:  Procedure Laterality Date   LABIOPLASTY N/A 03/21/2018   Procedure: LABIAPLASTY;  Surgeon: Irene Limbo, MD;  Location: [facility];  Service: Plastics;  Laterality: N/A;   STRABISMUS SURGERY Bilateral 04/16/2003   WISDOM TOOTH EXTRACTION        Vanessa Kick, MD 11/28/22 1655

## 2022-12-25 DIAGNOSIS — R41840 Attention and concentration deficit: Secondary | ICD-10-CM | POA: Diagnosis not present

## 2022-12-25 DIAGNOSIS — Z0000 Encounter for general adult medical examination without abnormal findings: Secondary | ICD-10-CM | POA: Diagnosis not present

## 2022-12-25 DIAGNOSIS — F331 Major depressive disorder, recurrent, moderate: Secondary | ICD-10-CM | POA: Diagnosis not present

## 2022-12-25 DIAGNOSIS — Z6821 Body mass index (BMI) 21.0-21.9, adult: Secondary | ICD-10-CM | POA: Diagnosis not present

## 2022-12-25 DIAGNOSIS — K5909 Other constipation: Secondary | ICD-10-CM | POA: Diagnosis not present

## 2022-12-25 DIAGNOSIS — R030 Elevated blood-pressure reading, without diagnosis of hypertension: Secondary | ICD-10-CM | POA: Diagnosis not present

## 2022-12-25 DIAGNOSIS — N921 Excessive and frequent menstruation with irregular cycle: Secondary | ICD-10-CM | POA: Diagnosis not present

## 2022-12-25 DIAGNOSIS — Z01419 Encounter for gynecological examination (general) (routine) without abnormal findings: Secondary | ICD-10-CM | POA: Diagnosis not present

## 2022-12-25 DIAGNOSIS — Z83438 Family history of other disorder of lipoprotein metabolism and other lipidemia: Secondary | ICD-10-CM | POA: Diagnosis not present

## 2022-12-26 DIAGNOSIS — Z13220 Encounter for screening for lipoid disorders: Secondary | ICD-10-CM | POA: Diagnosis not present

## 2022-12-26 DIAGNOSIS — Z0000 Encounter for general adult medical examination without abnormal findings: Secondary | ICD-10-CM | POA: Diagnosis not present

## 2023-03-14 DIAGNOSIS — L089 Local infection of the skin and subcutaneous tissue, unspecified: Secondary | ICD-10-CM | POA: Diagnosis not present

## 2023-05-02 DIAGNOSIS — Z3009 Encounter for other general counseling and advice on contraception: Secondary | ICD-10-CM | POA: Diagnosis not present

## 2023-05-02 DIAGNOSIS — Z3046 Encounter for surveillance of implantable subdermal contraceptive: Secondary | ICD-10-CM | POA: Diagnosis not present

## 2023-05-02 DIAGNOSIS — Z6821 Body mass index (BMI) 21.0-21.9, adult: Secondary | ICD-10-CM | POA: Diagnosis not present

## 2023-05-22 DIAGNOSIS — Z3046 Encounter for surveillance of implantable subdermal contraceptive: Secondary | ICD-10-CM | POA: Diagnosis not present

## 2023-05-22 DIAGNOSIS — Z6820 Body mass index (BMI) 20.0-20.9, adult: Secondary | ICD-10-CM | POA: Diagnosis not present

## 2023-05-30 DIAGNOSIS — Z4802 Encounter for removal of sutures: Secondary | ICD-10-CM | POA: Diagnosis not present

## 2023-05-30 DIAGNOSIS — Z3046 Encounter for surveillance of implantable subdermal contraceptive: Secondary | ICD-10-CM | POA: Diagnosis not present

## 2023-07-17 ENCOUNTER — Ambulatory Visit (HOSPITAL_COMMUNITY)
Admission: EM | Admit: 2023-07-17 | Discharge: 2023-07-17 | Disposition: A | Discharge status: Home / Self Care | Payer: 59 | Attending: Emergency Medicine | Admitting: Emergency Medicine

## 2023-07-17 ENCOUNTER — Encounter (HOSPITAL_COMMUNITY): Payer: Self-pay | Admitting: *Deleted

## 2023-07-17 ENCOUNTER — Other Ambulatory Visit: Payer: Self-pay

## 2023-07-17 DIAGNOSIS — J020 Streptococcal pharyngitis: Secondary | ICD-10-CM | POA: Diagnosis not present

## 2023-07-17 LAB — POCT RAPID STREP A (OFFICE): Rapid Strep A Screen: POSITIVE — AB

## 2023-07-17 MED ORDER — CEFDINIR 300 MG PO CAPS: 300.0000 mg | ORAL_CAPSULE | Freq: Two times a day (BID) | ORAL | 0 refills | Status: AC

## 2023-07-17 NOTE — ED Triage Notes (Signed)
At the end of triage Pt reported she has these crazy bumps on the back of her throat.

## 2023-07-17 NOTE — Discharge Instructions (Addendum)
Your strep test was positive.  Please take the antibiotics twice daily for the next 5 days with food to treat this.  You can alternate between 800 mg of ibuprofen and 500 mg of Tylenol every 4-6 hours for any body aches, fever or pains.  To help with your sore throat you can do warm saline gargles, drink tea with honey and sleep with a humidifier.  I suggest a bland diet to help with your diarrhea and stomach discomfort.  You can use over-the-counter Mucinex for nasal congestion, 1200 mg a day with at least 64 ounces of water.  Symptoms should improve over the next 72 hours on antibiotics, if no improvement or any changes please return to clinic.

## 2023-07-17 NOTE — ED Provider Notes (Signed)
[facility]    CSN: [id number] Arrival date & time: 07/17/23  1128      History   Chief Complaint Chief Complaint  Patient presents with   Sore Throat   Diarrhea   Fever    HPI Gail Reed is a 22 y.o. female.   Patient presents to clinic for complaints of sore throat, congestion, cough, fever, abdominal discomfort and diarrhea that started yesterday.  She also noted some white spots to the back of her throat that have been present for the past 5 days.  She has not had any medication today, but did take an over-the-counter oral medication for her stomach yesterday, ensure with the medication was.  She has not had any medications today.  Reports she had a few episodes of diarrhea yesterday, none today.  Endorses chronic issues with constipation, but taking medication causes diarrhea.  Denies recent sick contacts.  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    The history is provided by the patient and medical records.  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   Arthralgia 01/17/2021   Lymphadenopathy 01/17/2021   Weight loss 01/17/2021   Sleep deficient 10/28/2019   Acne 10/28/2019   Decreased appetite 10/28/2019   Sinus tachycardia 09/25/2018   Abnormal EKG 09/25/2018   Elevated BP without diagnosis of hypertension 04/28/2018   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      Home Medications    Prior to Admission medications   Medication Sig Start Date End Date Taking? Authorizing Provider  cefdinir (OMNICEF) 300 MG capsule Take 1 capsule (300 mg total) by mouth 2 (two) times daily for 5 days. 07/17/23 07/22/23 Yes Etoy Mcdonnell, [place] N, FNP  Ibuprofen (ADVIL PO) Take by mouth as needed.   Yes [provider]  diclofenac (VOLTAREN) 75 MG EC tablet Take 1 tablet (75 mg total) by mouth 2 (two) times daily. 11/28/22   Mardella Layman, MD  hydroxychloroquine (PLAQUENIL) 200 MG tablet Take 1 tablet (200 mg total) by mouth daily. Patient not taking: Reported on 01/18/2022 04/24/21   Fuller Plan, MD  Methylphenidate HCl ER, XR, (APTENSIO XR) 15 MG CP24 Take 15 mg by mouth daily. Patient not taking: Reported on 01/18/2022 05/08/21   Alfonso Ramus T, FNP  ondansetron (ZOFRAN-ODT) 4 MG disintegrating tablet Take 1 tablet (4 mg total) by mouth every 8 (eight) hours as needed for nausea or vomiting. 01/20/22   Rodriguez-Southworth,  Nettie Elm, PA-C  sertraline (ZOLOFT) 50 MG tablet Take 1 tablet (50 mg total) by mouth daily. Patient not taking: Reported on 01/18/2022 06/20/21   Alfonso Ramus T, FNP  Vitamin D, Ergocalciferol, (DRISDOL) 1.25 MG (50000 UNIT) CAPS capsule TAKE 1 CAPSULE (50,000 UNITS TOTAL) BY MOUTH EVERY 7 (SEVEN) DAYS Patient not taking: No sig reported 01/23/21   Verneda Skill, FNP    Family History Family History  Problem Relation Age of Onset   Hypertension Mother    Hypertension Father    Hyperlipidemia Father     Bipolar disorder Brother    ADD / ADHD Brother    ADD / ADHD Brother    Eating disorder Maternal Aunt    Hyperlipidemia Paternal Aunt    Hyperlipidemia Maternal Grandmother    Thyroid disease Maternal Grandmother    Thyroid disease Maternal Great-grandmother    Rheum arthritis Maternal Great-grandmother     Social History Social History   Tobacco Use   Smoking status: Light Smoker   Smokeless tobacco: Never  Vaping Use   Vaping status: Some Days   Substances: Nicotine  Substance Use Topics   Alcohol use: Yes   Drug use: Not Currently    Types: Marijuana     Allergies   Amoxicillin   Review of Systems Review of Systems  Per HPI   Physical Exam Triage Vital Signs ED Triage Vitals  Encounter Vitals Group     BP 07/17/23 1143 132/88     Systolic BP Percentile --      Diastolic BP Percentile --      Pulse Rate 07/17/23 1143 (!) 111     Resp 07/17/23 1143 20     Temp 07/17/23 1143 100.1 F (37.8 C)     Temp src --      SpO2 07/17/23 1143 94 %     Weight --      Height --      Head Circumference --      Peak Flow --      Pain Score 07/17/23 1140 8     Pain Loc --      Pain Education --      Exclude from Growth Chart --    No data found.  Updated Vital Signs BP 132/88   Pulse (!) 111   Temp 100.1 F (37.8 C)   Resp 20   SpO2 94%   Visual Acuity Right Eye Distance:   Left Eye Distance:   Bilateral Distance:    Right Eye Near:   Left Eye Near:    Bilateral Near:     Physical Exam Vitals and nursing note reviewed.  Constitutional:      Appearance: Normal appearance. She is well-developed.  HENT:     Head: Normocephalic and atraumatic.     Right Ear: External ear normal.     Left Ear: External ear normal.     Nose: Rhinorrhea present.     Mouth/Throat:     Mouth: Mucous membranes are moist.     Tonsils: Tonsillar exudate present. No tonsillar abscesses. 1+ on the right. 1+ on the left.  Eyes:     Conjunctiva/sclera: Conjunctivae normal.   Cardiovascular:     Rate and Rhythm: Normal rate and regular rhythm.     Heart sounds: Normal heart sounds. No murmur heard. Pulmonary:     Effort: Pulmonary effort is normal. No respiratory distress.     Breath sounds: Normal breath sounds.  Abdominal:     General:  Abdomen is flat. Bowel sounds are normal. There is no distension.     Palpations: Abdomen is soft.     Tenderness: There is no abdominal tenderness.  Lymphadenopathy:     Cervical: Cervical adenopathy present.  Skin:    General: Skin is warm and dry.  Neurological:     General: No focal deficit present.     Mental Status: She is alert.  Psychiatric:        Mood and Affect: Mood normal.      UC Treatments / Results  Labs (all labs ordered are listed, but only abnormal results are displayed) Labs Reviewed  POCT RAPID STREP A (OFFICE) - Abnormal; Notable for the following components:      Result Value   Rapid Strep A Screen Positive (*)    All other components within normal limits    EKG   Radiology No results found.  Procedures Procedures (including critical care time)  Medications Ordered in UC Medications - No data to display  Initial Impression / Assessment and Plan / UC Course  I have reviewed the triage vital signs and the nursing notes.  Pertinent labs & imaging results that were available during my care of the patient were reviewed by me and considered in my medical decision making (see chart for details).  Vitals and triage reviewed, patient is hemodynamically stable.  Lungs are vesicular, heart with regular rate and rhythm.  Abdomen soft and nontender with active bowel sounds.  Temp of 100.1 in clinic, declined Tylenol or ibuprofen.  Posterior pharynx with exudate, uvula midline, low concern for PTA.  POC rapid strep positive, sent in cefdinir due to history of amoxicillin rash.  Symptomatic management for URI symptoms and pharyngitis discussed.  Plan of care, follow-up care return precautions  given, no questions at this time.  Work note provided.     Final Clinical Impressions(s) / UC Diagnoses   Final diagnoses:  Strep pharyngitis     Discharge Instructions      Your strep test was positive.  Please take the antibiotics twice daily for the next 5 days with food to treat this.  You can alternate between 800 mg of ibuprofen and 500 mg of Tylenol every 4-6 hours for any body aches, fever or pains.  To help with your sore throat you can do warm saline gargles, drink tea with honey and sleep with a humidifier.  I suggest a bland diet to help with your diarrhea and stomach discomfort.  You can use over-the-counter Mucinex for nasal congestion, 1200 mg a day with at least 64 ounces of water.  Symptoms should improve over the next 72 hours on antibiotics, if no improvement or any changes please return to clinic.      ED Prescriptions     Medication Sig Dispense Auth. Provider   cefdinir (OMNICEF) 300 MG capsule Take 1 capsule (300 mg total) by mouth 2 (two) times daily for 5 days. 10 capsule Sharilyn Geisinger, [place] N, FNP      PDMP not reviewed this encounter.   Meng Winterton, [place] N, [place] 07/17/23 1212

## 2023-07-17 NOTE — ED Triage Notes (Signed)
Pt reports Fever last night of 102 .diarrhea and sore throat. Pt has also had general body aches.
